# Patient Record
Sex: Female | Born: 1945 | Race: White | Hispanic: No | Marital: Married | Smoking: Former smoker
Health system: Southern US, Community
[De-identification: ages and names within clinical notes are randomized; demographics above are authoritative.]

## PROBLEM LIST (undated history)

## (undated) DIAGNOSIS — J449 Chronic obstructive pulmonary disease, unspecified: Secondary | ICD-10-CM

---

## 1995-04-02 ENCOUNTER — Encounter (INDEPENDENT_AMBULATORY_CARE_PROVIDER_SITE_OTHER): Payer: Self-pay | Admitting: *Deleted

## 1995-05-07 ENCOUNTER — Encounter (INDEPENDENT_AMBULATORY_CARE_PROVIDER_SITE_OTHER): Payer: Self-pay | Admitting: *Deleted

## 1997-01-05 ENCOUNTER — Encounter (INDEPENDENT_AMBULATORY_CARE_PROVIDER_SITE_OTHER): Payer: Self-pay | Admitting: *Deleted

## 1997-12-10 ENCOUNTER — Other Ambulatory Visit: Admission: RE | Admit: 1997-12-10 | Discharge: 1997-12-10 | Payer: Self-pay | Admitting: Family Medicine

## 1998-01-22 ENCOUNTER — Emergency Department (HOSPITAL_COMMUNITY): Admission: EM | Admit: 1998-01-22 | Discharge: 1998-01-22 | Payer: Self-pay | Admitting: Emergency Medicine

## 1998-08-25 ENCOUNTER — Encounter (INDEPENDENT_AMBULATORY_CARE_PROVIDER_SITE_OTHER): Payer: Self-pay | Admitting: *Deleted

## 1999-02-03 ENCOUNTER — Other Ambulatory Visit: Admission: RE | Admit: 1999-02-03 | Discharge: 1999-02-03 | Payer: Self-pay | Admitting: Family Medicine

## 1999-03-13 ENCOUNTER — Emergency Department (HOSPITAL_COMMUNITY): Admission: EM | Admit: 1999-03-13 | Discharge: 1999-03-13 | Payer: Self-pay | Admitting: Emergency Medicine

## 1999-03-24 ENCOUNTER — Encounter: Payer: Self-pay | Admitting: Family Medicine

## 1999-03-24 ENCOUNTER — Encounter: Admission: RE | Admit: 1999-03-24 | Discharge: 1999-03-24 | Payer: Self-pay | Admitting: Family Medicine

## 1999-04-11 ENCOUNTER — Encounter (INDEPENDENT_AMBULATORY_CARE_PROVIDER_SITE_OTHER): Payer: Self-pay | Admitting: *Deleted

## 1999-04-17 ENCOUNTER — Encounter: Admission: RE | Admit: 1999-04-17 | Discharge: 1999-04-17 | Payer: Self-pay | Admitting: Urology

## 1999-04-17 ENCOUNTER — Encounter: Payer: Self-pay | Admitting: Urology

## 1999-05-10 ENCOUNTER — Encounter (INDEPENDENT_AMBULATORY_CARE_PROVIDER_SITE_OTHER): Payer: Self-pay | Admitting: *Deleted

## 1999-06-20 ENCOUNTER — Other Ambulatory Visit: Admission: RE | Admit: 1999-06-20 | Discharge: 1999-06-20 | Payer: Self-pay | Admitting: Podiatry

## 1999-11-01 ENCOUNTER — Encounter: Payer: Self-pay | Admitting: Gastroenterology

## 2000-06-20 ENCOUNTER — Other Ambulatory Visit: Admission: RE | Admit: 2000-06-20 | Discharge: 2000-06-20 | Payer: Self-pay | Admitting: Family Medicine

## 2000-07-11 ENCOUNTER — Encounter: Admission: RE | Admit: 2000-07-11 | Discharge: 2000-07-11 | Payer: Self-pay | Admitting: Family Medicine

## 2000-07-11 ENCOUNTER — Encounter: Payer: Self-pay | Admitting: Family Medicine

## 2001-07-30 ENCOUNTER — Other Ambulatory Visit: Admission: RE | Admit: 2001-07-30 | Discharge: 2001-07-30 | Payer: Self-pay | Admitting: Family Medicine

## 2001-09-17 ENCOUNTER — Encounter: Payer: Self-pay | Admitting: Family Medicine

## 2001-09-17 ENCOUNTER — Encounter: Admission: RE | Admit: 2001-09-17 | Discharge: 2001-09-17 | Payer: Self-pay | Admitting: Family Medicine

## 2002-12-11 ENCOUNTER — Other Ambulatory Visit: Admission: RE | Admit: 2002-12-11 | Discharge: 2002-12-11 | Payer: Self-pay | Admitting: Family Medicine

## 2002-12-24 ENCOUNTER — Encounter: Admission: RE | Admit: 2002-12-24 | Discharge: 2002-12-24 | Payer: Self-pay | Admitting: Family Medicine

## 2002-12-24 ENCOUNTER — Encounter: Payer: Self-pay | Admitting: Family Medicine

## 2002-12-24 ENCOUNTER — Encounter (INDEPENDENT_AMBULATORY_CARE_PROVIDER_SITE_OTHER): Payer: Self-pay | Admitting: *Deleted

## 2003-09-15 ENCOUNTER — Encounter: Admission: RE | Admit: 2003-09-15 | Discharge: 2003-09-15 | Payer: Self-pay | Admitting: Family Medicine

## 2003-09-15 ENCOUNTER — Encounter (INDEPENDENT_AMBULATORY_CARE_PROVIDER_SITE_OTHER): Payer: Self-pay | Admitting: *Deleted

## 2004-05-15 ENCOUNTER — Other Ambulatory Visit: Admission: RE | Admit: 2004-05-15 | Discharge: 2004-05-15 | Payer: Self-pay | Admitting: Family Medicine

## 2004-06-20 ENCOUNTER — Encounter: Admission: RE | Admit: 2004-06-20 | Discharge: 2004-06-20 | Payer: Self-pay | Admitting: Family Medicine

## 2004-07-27 LAB — CONVERTED CEMR LAB: Pap Smear: NORMAL

## 2005-07-25 ENCOUNTER — Ambulatory Visit: Payer: Self-pay | Admitting: Gastroenterology

## 2005-08-06 ENCOUNTER — Other Ambulatory Visit: Admission: RE | Admit: 2005-08-06 | Discharge: 2005-08-06 | Payer: Self-pay | Admitting: Family Medicine

## 2005-08-06 ENCOUNTER — Encounter (INDEPENDENT_AMBULATORY_CARE_PROVIDER_SITE_OTHER): Payer: Self-pay | Admitting: *Deleted

## 2005-08-07 ENCOUNTER — Encounter: Admission: RE | Admit: 2005-08-07 | Discharge: 2005-08-07 | Payer: Self-pay | Admitting: Family Medicine

## 2005-08-08 ENCOUNTER — Ambulatory Visit: Payer: Self-pay | Admitting: Gastroenterology

## 2005-08-08 ENCOUNTER — Encounter (INDEPENDENT_AMBULATORY_CARE_PROVIDER_SITE_OTHER): Payer: Self-pay | Admitting: *Deleted

## 2006-09-10 ENCOUNTER — Encounter (INDEPENDENT_AMBULATORY_CARE_PROVIDER_SITE_OTHER): Payer: Self-pay | Admitting: *Deleted

## 2006-09-10 ENCOUNTER — Encounter: Admission: RE | Admit: 2006-09-10 | Discharge: 2006-09-10 | Payer: Self-pay | Admitting: Family Medicine

## 2007-07-18 ENCOUNTER — Encounter (INDEPENDENT_AMBULATORY_CARE_PROVIDER_SITE_OTHER): Payer: Self-pay | Admitting: *Deleted

## 2007-07-18 ENCOUNTER — Encounter: Admission: RE | Admit: 2007-07-18 | Discharge: 2007-07-18 | Payer: Self-pay | Admitting: Family Medicine

## 2007-07-22 ENCOUNTER — Encounter (INDEPENDENT_AMBULATORY_CARE_PROVIDER_SITE_OTHER): Payer: Self-pay | Admitting: *Deleted

## 2007-08-28 ENCOUNTER — Encounter (INDEPENDENT_AMBULATORY_CARE_PROVIDER_SITE_OTHER): Payer: Self-pay | Admitting: *Deleted

## 2007-10-14 ENCOUNTER — Encounter: Admission: RE | Admit: 2007-10-14 | Discharge: 2007-10-14 | Payer: Self-pay | Admitting: Family Medicine

## 2007-10-14 ENCOUNTER — Encounter (INDEPENDENT_AMBULATORY_CARE_PROVIDER_SITE_OTHER): Payer: Self-pay | Admitting: *Deleted

## 2008-07-14 ENCOUNTER — Telehealth: Payer: Self-pay | Admitting: Gastroenterology

## 2008-07-15 ENCOUNTER — Encounter (INDEPENDENT_AMBULATORY_CARE_PROVIDER_SITE_OTHER): Payer: Self-pay | Admitting: *Deleted

## 2008-07-30 ENCOUNTER — Ambulatory Visit: Payer: Self-pay | Admitting: *Deleted

## 2008-07-30 DIAGNOSIS — F329 Major depressive disorder, single episode, unspecified: Secondary | ICD-10-CM

## 2008-07-30 DIAGNOSIS — M1990 Unspecified osteoarthritis, unspecified site: Secondary | ICD-10-CM | POA: Insufficient documentation

## 2008-07-30 DIAGNOSIS — K219 Gastro-esophageal reflux disease without esophagitis: Secondary | ICD-10-CM

## 2008-07-30 DIAGNOSIS — J309 Allergic rhinitis, unspecified: Secondary | ICD-10-CM | POA: Insufficient documentation

## 2008-07-30 DIAGNOSIS — E785 Hyperlipidemia, unspecified: Secondary | ICD-10-CM | POA: Insufficient documentation

## 2008-08-05 DIAGNOSIS — K449 Diaphragmatic hernia without obstruction or gangrene: Secondary | ICD-10-CM | POA: Insufficient documentation

## 2008-08-05 DIAGNOSIS — J449 Chronic obstructive pulmonary disease, unspecified: Secondary | ICD-10-CM

## 2008-08-05 DIAGNOSIS — M810 Age-related osteoporosis without current pathological fracture: Secondary | ICD-10-CM | POA: Insufficient documentation

## 2008-08-05 DIAGNOSIS — J4489 Other specified chronic obstructive pulmonary disease: Secondary | ICD-10-CM | POA: Insufficient documentation

## 2008-08-05 DIAGNOSIS — R319 Hematuria, unspecified: Secondary | ICD-10-CM

## 2008-09-20 ENCOUNTER — Ambulatory Visit: Payer: Self-pay | Admitting: Radiology

## 2008-09-20 ENCOUNTER — Ambulatory Visit (HOSPITAL_BASED_OUTPATIENT_CLINIC_OR_DEPARTMENT_OTHER): Admission: RE | Admit: 2008-09-20 | Discharge: 2008-09-20 | Payer: Self-pay | Admitting: Internal Medicine

## 2008-09-20 ENCOUNTER — Telehealth: Payer: Self-pay | Admitting: Internal Medicine

## 2008-09-20 ENCOUNTER — Ambulatory Visit: Payer: Self-pay | Admitting: Gastroenterology

## 2008-09-20 ENCOUNTER — Ambulatory Visit: Payer: Self-pay | Admitting: Internal Medicine

## 2008-09-20 LAB — CONVERTED CEMR LAB
AST: 22 units/L (ref 0–37)
Albumin: 3.8 g/dL (ref 3.5–5.2)
Alkaline Phosphatase: 99 units/L (ref 39–117)
Basophils Relative: 0.5 % (ref 0.0–3.0)
Bilirubin, Direct: 0.1 mg/dL (ref 0.0–0.3)
Calcium: 9.4 mg/dL (ref 8.4–10.5)
Eosinophils Absolute: 0.3 10*3/uL (ref 0.0–0.7)
Eosinophils Relative: 5.5 % — ABNORMAL HIGH (ref 0.0–5.0)
GFR calc non Af Amer: 107 mL/min (ref >60)
Glucose, Bld: 93 mg/dL (ref 70–99)
HDL: 63.1 mg/dL (ref >39.00)
Hemoglobin: 15.3 g/dL — ABNORMAL HIGH (ref 12.0–15.0)
Lymphocytes Relative: 22.3 % (ref 12.0–46.0)
Monocytes Relative: 10.1 % (ref 3.0–12.0)
Neutro Abs: 4 10*3/uL (ref 1.4–7.7)
Neutrophils Relative %: 61.6 % (ref 43.0–77.0)
Potassium: 3.8 meq/L (ref 3.5–5.1)
RBC: 4.68 M/uL (ref 3.87–5.11)
Sodium: 142 meq/L (ref 135–145)
TSH: 1.75 microintl units/mL (ref 0.35–5.50)
Total CHOL/HDL Ratio: 3
Total Protein: 7.1 g/dL (ref 6.0–8.3)
VLDL: 19.6 mg/dL (ref 0.0–40.0)
WBC: 6.3 10*3/uL (ref 4.5–10.5)

## 2008-09-21 ENCOUNTER — Ambulatory Visit: Payer: Self-pay | Admitting: Internal Medicine

## 2008-09-21 ENCOUNTER — Encounter: Payer: Self-pay | Admitting: Internal Medicine

## 2008-09-23 ENCOUNTER — Encounter: Admission: RE | Admit: 2008-09-23 | Discharge: 2008-09-23 | Payer: Self-pay | Admitting: Internal Medicine

## 2008-09-23 ENCOUNTER — Telehealth: Payer: Self-pay | Admitting: Internal Medicine

## 2008-09-27 ENCOUNTER — Ambulatory Visit: Payer: Self-pay | Admitting: Gastroenterology

## 2008-09-27 ENCOUNTER — Encounter: Payer: Self-pay | Admitting: Gastroenterology

## 2008-09-28 ENCOUNTER — Encounter: Payer: Self-pay | Admitting: Gastroenterology

## 2008-09-29 ENCOUNTER — Ambulatory Visit (HOSPITAL_COMMUNITY): Admission: RE | Admit: 2008-09-29 | Discharge: 2008-09-29 | Payer: Self-pay | Admitting: Internal Medicine

## 2008-09-30 ENCOUNTER — Encounter: Payer: Self-pay | Admitting: Internal Medicine

## 2008-09-30 ENCOUNTER — Ambulatory Visit: Payer: Self-pay | Admitting: Critical Care Medicine

## 2008-10-06 ENCOUNTER — Ambulatory Visit: Payer: Self-pay | Admitting: Thoracic Surgery

## 2008-10-28 ENCOUNTER — Ambulatory Visit: Payer: Self-pay | Admitting: Thoracic Surgery

## 2008-10-28 ENCOUNTER — Encounter: Payer: Self-pay | Admitting: Thoracic Surgery

## 2008-10-28 ENCOUNTER — Inpatient Hospital Stay (HOSPITAL_COMMUNITY): Admission: RE | Admit: 2008-10-28 | Discharge: 2008-11-03 | Payer: Self-pay | Admitting: Thoracic Surgery

## 2008-11-08 ENCOUNTER — Ambulatory Visit: Payer: Self-pay | Admitting: Diagnostic Radiology

## 2008-11-08 ENCOUNTER — Encounter: Payer: Self-pay | Admitting: Emergency Medicine

## 2008-11-08 ENCOUNTER — Inpatient Hospital Stay (HOSPITAL_COMMUNITY): Admission: EM | Admit: 2008-11-08 | Discharge: 2008-11-13 | Payer: Self-pay | Admitting: Emergency Medicine

## 2008-11-19 ENCOUNTER — Ambulatory Visit: Payer: Self-pay | Admitting: Thoracic Surgery

## 2008-11-23 ENCOUNTER — Ambulatory Visit: Payer: Self-pay | Admitting: Thoracic Surgery

## 2008-11-23 ENCOUNTER — Encounter: Admission: RE | Admit: 2008-11-23 | Discharge: 2008-11-23 | Payer: Self-pay | Admitting: Thoracic Surgery

## 2008-12-08 ENCOUNTER — Encounter: Admission: RE | Admit: 2008-12-08 | Discharge: 2008-12-08 | Payer: Self-pay | Admitting: Thoracic Surgery

## 2008-12-08 ENCOUNTER — Ambulatory Visit: Payer: Self-pay | Admitting: Thoracic Surgery

## 2008-12-10 ENCOUNTER — Telehealth: Payer: Self-pay | Admitting: Internal Medicine

## 2008-12-28 ENCOUNTER — Ambulatory Visit: Payer: Self-pay | Admitting: Family Medicine

## 2008-12-28 DIAGNOSIS — H6980 Other specified disorders of Eustachian tube, unspecified ear: Secondary | ICD-10-CM

## 2009-01-12 ENCOUNTER — Ambulatory Visit: Payer: Self-pay | Admitting: *Deleted

## 2009-01-26 ENCOUNTER — Encounter: Admission: RE | Admit: 2009-01-26 | Discharge: 2009-01-26 | Payer: Self-pay | Admitting: Thoracic Surgery

## 2009-01-26 ENCOUNTER — Ambulatory Visit: Payer: Self-pay | Admitting: Thoracic Surgery

## 2009-01-31 ENCOUNTER — Telehealth: Payer: Self-pay | Admitting: Internal Medicine

## 2009-02-01 ENCOUNTER — Ambulatory Visit: Payer: Self-pay | Admitting: Family Medicine

## 2009-04-05 ENCOUNTER — Encounter: Admission: RE | Admit: 2009-04-05 | Discharge: 2009-04-05 | Payer: Self-pay | Admitting: Thoracic Surgery

## 2009-04-05 ENCOUNTER — Ambulatory Visit: Payer: Self-pay | Admitting: Thoracic Surgery

## 2009-05-09 ENCOUNTER — Ambulatory Visit: Payer: Self-pay | Admitting: Family Medicine

## 2009-08-17 ENCOUNTER — Ambulatory Visit: Payer: Self-pay | Admitting: Family Medicine

## 2009-08-17 DIAGNOSIS — R1013 Epigastric pain: Secondary | ICD-10-CM | POA: Insufficient documentation

## 2009-08-18 LAB — CONVERTED CEMR LAB
ALT: 10 units/L (ref 0–35)
Amylase: 30 units/L (ref 0–105)
CO2: 26 meq/L (ref 19–32)
Calcium: 9.4 mg/dL (ref 8.4–10.5)
Chloride: 104 meq/L (ref 96–112)
Creatinine, Ser: 0.71 mg/dL (ref 0.40–1.20)
Eosinophils Absolute: 0.3 10*3/uL (ref 0.0–0.7)
Glucose, Bld: 83 mg/dL (ref 70–99)
HCT: 44.9 % (ref 36.0–46.0)
Hemoglobin: 14.8 g/dL (ref 12.0–15.0)
Lipase: 27 units/L (ref 0–75)
Lymphs Abs: 2.2 10*3/uL (ref 0.7–4.0)
MCHC: 33 g/dL (ref 30.0–36.0)
MCV: 92.8 fL (ref 78.0–100.0)
Monocytes Absolute: 0.7 10*3/uL (ref 0.1–1.0)
Monocytes Relative: 10 % (ref 3–12)
Neutrophils Relative %: 53 % (ref 43–77)
RBC: 4.84 M/uL (ref 3.87–5.11)
Total Protein: 6.9 g/dL (ref 6.0–8.3)
WBC: 6.8 10*3/uL (ref 4.0–10.5)

## 2009-08-31 ENCOUNTER — Ambulatory Visit: Payer: Self-pay | Admitting: Family Medicine

## 2009-09-27 ENCOUNTER — Ambulatory Visit: Payer: Self-pay | Admitting: Family Medicine

## 2009-09-27 DIAGNOSIS — L290 Pruritus ani: Secondary | ICD-10-CM

## 2009-10-21 ENCOUNTER — Ambulatory Visit: Payer: Self-pay | Admitting: Family Medicine

## 2009-10-26 ENCOUNTER — Encounter: Payer: Self-pay | Admitting: Family Medicine

## 2009-10-27 LAB — CONVERTED CEMR LAB
ALT: 8 units/L (ref 0–35)
AST: 14 units/L (ref 0–37)
Alkaline Phosphatase: 111 units/L (ref 39–117)
Basophils Absolute: 0 10*3/uL (ref 0.0–0.1)
Basophils Relative: 0 % (ref 0–1)
LDL Cholesterol: 111 mg/dL — ABNORMAL HIGH (ref 0–99)
MCHC: 32.6 g/dL (ref 30.0–36.0)
Neutro Abs: 3.7 10*3/uL (ref 1.7–7.7)
Neutrophils Relative %: 62 % (ref 43–77)
RBC: 4.62 M/uL (ref 3.87–5.11)
RDW: 12.8 % (ref 11.5–15.5)
Sodium: 141 meq/L (ref 135–145)
Total Bilirubin: 0.5 mg/dL (ref 0.3–1.2)
Total Protein: 7 g/dL (ref 6.0–8.3)
Triglycerides: 123 mg/dL (ref <150)
VLDL: 25 mg/dL (ref 0–40)

## 2009-11-15 ENCOUNTER — Encounter: Admission: RE | Admit: 2009-11-15 | Discharge: 2009-11-15 | Payer: Self-pay | Admitting: Family Medicine

## 2009-12-26 ENCOUNTER — Encounter: Payer: Self-pay | Admitting: Internal Medicine

## 2009-12-26 ENCOUNTER — Ambulatory Visit: Payer: Self-pay | Admitting: Family Medicine

## 2010-01-05 ENCOUNTER — Ambulatory Visit: Payer: Self-pay | Admitting: Critical Care Medicine

## 2010-03-27 ENCOUNTER — Ambulatory Visit: Payer: Self-pay | Admitting: Family Medicine

## 2010-04-05 DIAGNOSIS — K5730 Diverticulosis of large intestine without perforation or abscess without bleeding: Secondary | ICD-10-CM | POA: Insufficient documentation

## 2010-04-05 DIAGNOSIS — Z860100 Personal history of colon polyps, unspecified: Secondary | ICD-10-CM | POA: Insufficient documentation

## 2010-04-07 ENCOUNTER — Ambulatory Visit: Payer: Self-pay | Admitting: Gastroenterology

## 2010-04-07 ENCOUNTER — Encounter (INDEPENDENT_AMBULATORY_CARE_PROVIDER_SITE_OTHER): Payer: Self-pay | Admitting: *Deleted

## 2010-04-10 ENCOUNTER — Encounter: Payer: Self-pay | Admitting: Gastroenterology

## 2010-04-11 ENCOUNTER — Ambulatory Visit (HOSPITAL_COMMUNITY): Admission: RE | Admit: 2010-04-11 | Discharge: 2010-04-11 | Payer: Self-pay | Admitting: Gastroenterology

## 2010-04-11 LAB — CONVERTED CEMR LAB: Tissue Transglutaminase Ab, IgA: 8.6 units (ref <20)

## 2010-05-01 ENCOUNTER — Ambulatory Visit: Payer: Self-pay | Admitting: Gastroenterology

## 2010-05-01 DIAGNOSIS — K2990 Gastroduodenitis, unspecified, without bleeding: Secondary | ICD-10-CM

## 2010-05-01 DIAGNOSIS — K2970 Gastritis, unspecified, without bleeding: Secondary | ICD-10-CM | POA: Insufficient documentation

## 2010-05-04 ENCOUNTER — Encounter: Payer: Self-pay | Admitting: Gastroenterology

## 2010-05-04 LAB — CONVERTED CEMR LAB
ALT: 14 units/L (ref 0–35)
Alkaline Phosphatase: 70 units/L (ref 39–117)
Amylase: 36 units/L (ref 27–131)
Basophils Absolute: 0 10*3/uL (ref 0.0–0.1)
Bilirubin, Direct: 0.1 mg/dL (ref 0.0–0.3)
CO2: 27 meq/L (ref 19–32)
Chloride: 106 meq/L (ref 96–112)
Creatinine, Ser: 0.7 mg/dL (ref 0.4–1.2)
Hemoglobin: 14.4 g/dL (ref 12.0–15.0)
Lipase: 44 units/L (ref 11.0–59.0)
Lymphocytes Relative: 28.1 % (ref 12.0–46.0)
Monocytes Relative: 12.1 % — ABNORMAL HIGH (ref 3.0–12.0)
Neutro Abs: 2.8 10*3/uL (ref 1.4–7.7)
RBC: 4.42 M/uL (ref 3.87–5.11)
RDW: 13.1 % (ref 11.5–14.6)
Saturation Ratios: 47.7 % (ref 20.0–50.0)
Total Protein: 6.8 g/dL (ref 6.0–8.3)
Transferrin: 260.5 mg/dL (ref 212.0–360.0)
Vitamin B-12: 460 pg/mL (ref 211–911)
WBC: 5.2 10*3/uL (ref 4.5–10.5)

## 2010-05-11 ENCOUNTER — Ambulatory Visit: Payer: Self-pay | Admitting: Critical Care Medicine

## 2010-05-16 ENCOUNTER — Ambulatory Visit: Payer: Self-pay | Admitting: Gastroenterology

## 2010-05-16 DIAGNOSIS — K589 Irritable bowel syndrome without diarrhea: Secondary | ICD-10-CM

## 2010-06-12 ENCOUNTER — Ambulatory Visit (HOSPITAL_COMMUNITY)
Admission: RE | Admit: 2010-06-12 | Discharge: 2010-06-12 | Payer: Self-pay | Source: Home / Self Care | Attending: Gastroenterology | Admitting: Gastroenterology

## 2010-06-15 ENCOUNTER — Ambulatory Visit
Admission: RE | Admit: 2010-06-15 | Discharge: 2010-06-15 | Payer: Self-pay | Source: Home / Self Care | Attending: Gastroenterology | Admitting: Gastroenterology

## 2010-06-25 ENCOUNTER — Encounter: Payer: Self-pay | Admitting: Internal Medicine

## 2010-06-26 ENCOUNTER — Encounter: Payer: Self-pay | Admitting: Thoracic Surgery

## 2010-06-27 ENCOUNTER — Ambulatory Visit
Admission: RE | Admit: 2010-06-27 | Discharge: 2010-06-27 | Payer: Self-pay | Source: Home / Self Care | Attending: Family Medicine | Admitting: Family Medicine

## 2010-07-02 LAB — CONVERTED CEMR LAB: Vit D, 1,25-Dihydroxy: 37

## 2010-07-04 NOTE — Assessment & Plan Note (Signed)
Summary: f/u mood   Vital Signs:  Patient profile:   65 year old female Height:      62 inches Weight:      119 pounds BMI:     21.84 O2 Sat:      97 % on Room air Pulse rate:   61 / minute BP sitting:   125 / 83  (left arm) Cuff size:   regular  Vitals Entered By: Payton Spark CMA (December 26, 2009 9:43 AM)  O2 Flow:  Room air CC: F/U mood   Primary Care Provider:  Seymour Bars DO  CC:  F/U mood.  History of Present Illness: 65 yo WF presents for f/u mood.  She was restarted on her SSRI 2 mos ago.  She reports being lonely even though she has her husband for support.  She spent so much time taking care of her mom, who died that she lost touch with her friends.  She is having trouble sleeping at night.  Taking Citalopram 10 mg in the AM.  She was restarted on Omeprazole last visit, 40 mg/ day.  She reports continuing to have some episodic a abdominal pains.  She has some constipation.  She has some epigastric pressure.  It is worse after eating or from eating a lot.         Current Medications (verified): 1)  Crestor 10 Mg Tabs (Rosuvastatin Calcium) .... Take 1 Tablet By Mouth Once A Day 2)  Veramyst 27.5 Mcg/spray Susp (Fluticasone Furoate) .... 2 Sprays Per Nostril Daily 3)  Citalopram Hydrobromide 10 Mg Tabs (Citalopram Hydrobromide) .Marland Kitchen.. 1 Tab By Mouth Daily 4)  Omeprazole 40 Mg Cpdr (Omeprazole) .Marland Kitchen.. 1 Tab By Mouth Daily, Take 30 Min Before Breakfast  Allergies (verified): 1)  ! Celebrex 2)  ! Vioxx 3)  ! Codeine 4)  ! Boniva (Ibandronate Sodium) 5)  ! Prednisone  Past History:  Past Medical History: Allergic rhinitis Colonic polyps, hx of Depression Hyperlipidemia  Osteoarthritis hiatal hernia GERD Hx of Tobacco abuse lung nodule, non cancerous 2010 moderate COPD on PFTs -- Dr Delford Field 09-2008  Past Surgical History: Reviewed history from 02/01/2009 and no changes required. spinal fusion 1998 Hysterectomy  removal of lung nodule Dr Edwyna Shell 10-2008 with  post op lung collapse  Family History: Reviewed history from 05/09/2009 and no changes required. Family History Diabetes 1st degree relative Family History High cholesterol Family History Hypertension Breast ca - no Ovarian ca - no Diabetes Dementia - mother; died at 95  Social History: Reviewed history from 08/17/2009 and no changes required. 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   Head:  normocephalic and atraumatic.   Eyes:  sclera non icteric Mouth:  pharynx pink and moist.   Neck:  no masses.   Lungs:  prolonged exp phase w/o audible W/R/R; nonlabored Heart:  Normal rate and regular rhythm. S1 and S2 normal without gallop, murmur, click, rub or other extra sounds. Abdomen:  soft.  slight epigastric TTP but no R/G/R, no HSM Extremities:  no LE edema Skin:  no pallor or jaundice Cervical Nodes:  No lymphadenopathy noted Psych:  good eye contact, not anxious appearing, and not depressed appearing.     Impression & Recommendations:  Problem # 1:  DEPRESSION (ICD-311) PHQ9  score of 6 c/w mild depression.  Will increase her Citalopram from 10--> 20 mg/ day and add Clonazepam at night for sleep.  Encouraged more social interaction as loneliness and lack of social life seems to be a big part of her mood.  Call if any problems, o/w f/u in 3 mos. Her updated medication list for this problem includes:    Citalopram Hydrobromide 20 Mg Tabs (Citalopram hydrobromide) .Marland Kitchen... 1 tab by mouth qhs    Clonazepam 0.5 Mg Tabs (Clonazepam) .Marland Kitchen... 1-2 tabs by mouth at bedtime as needed anxiety/ sleep  Problem # 2:  EPIGASTRIC PAIN (ICD-789.06) Unchanged.  She has continued to c/o epigastric pain since her last visit even with daily PPI usage.  She has hx of  hiatal hernia and does have some symptoms of acid reflux.  We have already discussed dietary modificiation, reflux precautions.  She sees Dr Jarold Motto and had a colonoscopy in 2010 but has not had an EGD for years.  I will get her back in to see him to see if an EGD is in order.  Continue Omeprazole daily.  Problem # 3:  COPD (ICD-496) Had PFTs with Dr Delford Field in the Spring of 2010 and had moderate COPD.  Since then, she has quit smoking.  She is not using any inhalers and denies chronic cough.  Has some DOE but not enough to 'bother' her.  I'd like to get her back in with Dr Delford Field for re-eval.  may need repeat PFTs to see if improved off cigarettes.  She likely needs to be maintained on Spiriva but she is resistant to this today.  Complete Medication List: 1)  Crestor 10 Mg Tabs (Rosuvastatin calcium) .... Take 1 tablet by mouth once a day 2)  Veramyst 27.5 Mcg/spray Susp (Fluticasone furoate) .... 2 sprays per nostril daily 3)  Citalopram Hydrobromide 20 Mg Tabs (Citalopram hydrobromide) .Marland Kitchen.. 1 tab by mouth qhs 4)  Omeprazole 40 Mg Cpdr (Omeprazole) .Marland Kitchen.. 1 tab by mouth daily, take 30 min before breakfast 5)  Clonazepam 0.5 Mg Tabs (Clonazepam) .Marland Kitchen.. 1-2 tabs by mouth at bedtime as needed anxiety/ sleep  Patient Instructions: 1)  Increase Citalopram to 20 mg and take this at night. 2)  Use Clonazepam 1-2 tabs at bedtime as needed for sleep/ anxiety. 3)  Call if any problems. 4)  Will get you back in with Dr Delford Field and Dr Jarold Motto.   5)  Return for f/u in 3 mos. Prescriptions: CLONAZEPAM 0.5 MG TABS (CLONAZEPAM) 1-2 tabs by mouth at bedtime as needed anxiety/ sleep  #60 x 0   Entered and Authorized by:   Seymour Bars DO   Signed by:   Seymour Bars DO on 12/26/2009   Method used:   Printed then faxed to ...       [facility] [phone]* (retail)       [address]       [place], [place]  [address]       Ph: [phone]       Fax: [phone]   RxID:    [id number] CITALOPRAM HYDROBROMIDE 20 MG TABS (CITALOPRAM HYDROBROMIDE) 1 tab by mouth qhs  #30 x 3   Entered and Authorized by:   Seymour Bars DO   Signed by:   Seymour Bars DO on 12/26/2009   Method used:   Electronically to        [facility] [phone]* (retail)  [address]       [place], [place]  [address]       Ph: [phone]       Fax: [phone]   RxID:   [id number]

## 2010-07-04 NOTE — Letter (Signed)
Summary: Depression Questionnaire  Depression Questionnaire   Imported By: Lanelle Bal 01/02/2010 10:55:44  _____________________________________________________________________  External Attachment:    Type:   Image     Comment:   External Document

## 2010-07-04 NOTE — Procedures (Signed)
Summary: Upper Endoscopy  Patient: Linda Rangel Note: All result statuses are Final unless otherwise noted.  Tests: (1) Upper Endoscopy (EGD)   EGD Upper Endoscopy       DONE     [facility]     [address]     [place], [place]  [address]           ENDOSCOPY PROCEDURE REPORT           PATIENT:  [name], [name]  MR#:  [id number]     BIRTHDATE:  [birth year], 64 yrs. old  GENDER:  female           ENDOSCOPIST:  Vania Rea. Jarold Motto, MD, [facility]     Referred by:  Seymour Bars, D.O.           PROCEDURE DATE:  05/01/2010     PROCEDURE:  EGD with biopsy, [address], Elease Hashimoto Dilation of Esophagus     ASA CLASS:  Class III     INDICATIONS:  chest pain, small bowel biopsy to rule out celiac     sprue ABDOMINAL ULTRASOUND IS NORMAL.           MEDICATIONS:   Fentanyl 75 mcg IV, Versed 8 mg IV     TOPICAL ANESTHETIC:  Exactacain Spray           DESCRIPTION OF PROCEDURE:   After the risks benefits and     alternatives of the procedure were thoroughly explained, informed     consent was obtained.  The LB GIF-H180 [id number] endoscope was     introduced through the mouth and advanced to the second portion of     the duodenum, without limitations.  The instrument was slowly     withdrawn as the mucosa was fully examined.     <<PROCEDUREIMAGES>>           Normal GE junction was noted. DILATED #70F MALONEY DILATOR.     Moderate gastritis was found in the antrum. NODULAR AND FRIABLE     ANTRUM AND BULB.CLO AND REGULAR BIOPSIES.  other findings.     FLATTENED DUODENAL FOLDS BIOPSIED.    Retroflexed views revealed     no abnormalities.    The scope was then withdrawn from the patient     and the procedure completed.           COMPLICATIONS:  None           ENDOSCOPIC IMPRESSION:     1) Normal GE junction     2) Moderate gastritis in the antrum     3) Other findings     1.TREATED GERD AND ?? STRICTURE DILATED.     2.R/O H.PYLORI.     3.R/O CELIAC DISEASE PER ++ SEROLOGY.  RECOMMENDATIONS:     1) Await pathology results     2) OP follow-up in 2 weeks.     3) Rx CLO if positive     4) continue current medications           REPEAT EXAM:  No           ______________________________     Vania Rea. Jarold Motto, MD, [facility]           CC:  Karle Plumber, MD           n.     Rosalie Doctor:   Vania Rea. Patterson at 05/01/2010 03:39 PM           Lytle Michaels, [phone]  Note: An exclamation mark Marland Kitchen)  indicates a result that was not dispersed into the flowsheet. Document Creation Date: 05/01/2010 3:40 PM _______________________________________________________________________  (1) Order result status: Final Collection or observation date-time: 05/01/2010 15:28 Requested date-time:  Receipt date-time:  Reported date-time:  Referring Physician:   Ordering Physician: Sheryn Bison [phone]) Specimen Source:  Source: Launa Grill Order Number: [id number] Lab site:

## 2010-07-04 NOTE — Assessment & Plan Note (Signed)
Summary: epigastric pain   Vital Signs:  Patient profile:   65 year old female Height:      62 inches Weight:      117 pounds BMI:     21.48 O2 Sat:      98 % on Room air Temp:     98.1 degrees F oral Pulse rate:   78 / minute BP sitting:   131 / 78  (left arm) Cuff size:   regular  Vitals Entered By: Payton Spark CMA (August 17, 2009 1:33 PM)  O2 Flow:  Room air CC: Hernia pressure and belching x 2 months. Prevacid not helping.   Primary Care Provider:  Seymour Bars DO  CC:  Hernia pressure and belching x 2 months. Prevacid not helping.Marland Kitchen  History of Present Illness: 65 yo WF presents for problems with her hiatal hernia.  She is taking Prevacid OTc almost everyday.  It has not helped.  She has epigastric pain.  The pain is worse with eating.  She has some belching and bloating.  She is taking MOM and stool softener.  Denies melena or hematochezia.  She has gained weight.  She feels hungry but it causes early satiety and dyspepsia with epigastric pain.  This started about 2 mos ago.   She has never had PUD.  She has been taking Advil PM at night most nights.  She drinks ETOH - 2 drinks/ day.  Her last EGD was in 1999.    Current Medications (verified): 1)  Crestor 10 Mg Tabs (Rosuvastatin Calcium) .... Take 1 Tablet By Mouth Once A Day 2)  Zyrtec Allergy 10 Mg Tabs (Cetirizine Hcl) .Marland Kitchen.. 1 Tab By Mouth Qpm 3)  Veramyst 27.5 Mcg/spray Susp (Fluticasone Furoate) .... 2 Sprays Per Nostril Daily 4)  Spiriva Handihaler 18 Mcg  Caps (Tiotropium Bromide Monohydrate) .... Two Puffs in Handihaler Daily  Allergies (verified): 1)  ! Celebrex 2)  ! Vioxx 3)  ! Codeine 4)  ! Boniva (Ibandronate Sodium) 5)  ! Prednisone  Past History:  Past Medical History: Reviewed history from 02/01/2009 and no changes required. Allergic rhinitis Colonic polyps, hx of Depression Hyperlipidemia  Osteoarthritis hiatal hernia GERD Hx of Tobacco abuse lung nodule, non cancerous 2010  Past  Surgical History: Reviewed history from 02/01/2009 and no changes required. spinal fusion 1998 Hysterectomy  removal of lung nodule Dr Edwyna Shell 10-2008 with post op lung collapse  Social History: Reviewed history from 05/09/2009 and no changes required. 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   Head:  normocephalic and atraumatic.   Eyes:  sclera non icteric Nose:  no nasal discharge.   Mouth:  pharynx pink and moist.   Neck:  no masses.   Lungs:  Normal respiratory effort, chest expands symmetrically. Lungs are clear to auscultation, no crackles or wheezes. Heart:  Normal rate and regular rhythm. S1 and S2 normal without gallop, murmur, click, rub or other extra sounds. Abdomen:  soft.  slight epigastric TTP w/o R/G/R.  NABS.  No AA bruits.  Neg Murphys sign.  no HSM. Extremities:  no LE edema Skin:  color normal.  no jaundice   Impression & Recommendations:  Problem # 1:  EPIGASTRIC PAIN (ICD-789.06) Assessment Deteriorated Likely reflux gastritis with hx of hiatal hernia.  Will r/o anemia, gall bladder dysfunction, hepatic dysfunction, pancreatitis with labs today. Start on samples of Dexilant 1 tab daily for reflux.  Work on reflux precautions. F/U in 2-3 wks.   Orders: T-CBC w/Diff ([id number]) T-Comprehensive Metabolic Panel [phone] [phone]) T-Amylase ([id number]) T-Lipase ([id number])  Complete Medication List: 1)  Crestor 10 Mg Tabs (Rosuvastatin calcium) .... Take 1 tablet by mouth once a day 2)  Zyrtec Allergy 10 Mg Tabs (Cetirizine hcl) .Marland Kitchen.. 1 tab by mouth qpm 3)  Veramyst 27.5 Mcg/spray Susp (Fluticasone furoate) .... 2 sprays per nostril daily 4)  Spiriva Handihaler 18 Mcg Caps (Tiotropium bromide monohydrate) .... Two puffs  in handihaler daily  Patient Instructions: 1)  Labs today. 2)  Start on Dexilant 1 capsule by mouth once daily for acid reflux. 3)  Stick to reflux precautions. 4)  Will call you w/ lab results tomorrow. 5)  Return for f/u in 3 wks.

## 2010-07-04 NOTE — Assessment & Plan Note (Signed)
Summary: CPE   Vital Signs:  Patient profile:   65 year old female Height:      62 inches Weight:      117 pounds BMI:     21.48 O2 Sat:      96 % on Room air Pulse rate:   87 / minute BP sitting:   122 / 78  (left arm) Cuff size:   regular  Vitals Entered By: Payton Spark CMA (Oct 21, 2009 3:21 PM)  O2 Flow:  Room air CC: CPE w/ out pap. Not taking Crestor.   Primary Care Provider:  Seymour Bars DO  CC:  CPE w/ out pap. Not taking Crestor.Linda Rangel  History of Present Illness: 65 yo WF presents for CPE w/o pap smear.  She is married, postmenopausal.  Doing well other than continued epigastric pain with a known hiatal hernia.  She c/o feeling short winded but has no cough or wheezing.  Her colonoscopy was updated 1 yr ago.  DEXA done 1 yr ago showed osteopenia with a T score of -2.3.  Her mammogram and fasting labs are due.    She is not having any postmenopausal bleeding.    Current Medications (verified): 1)  Crestor 10 Mg Tabs (Rosuvastatin Calcium) .... Take 1 Tablet By Mouth Once A Day 2)  Veramyst 27.5 Mcg/spray Susp (Fluticasone Furoate) .... 2 Sprays Per Nostril Daily  Allergies (verified): 1)  ! Celebrex 2)  ! Vioxx 3)  ! Codeine 4)  ! Boniva (Ibandronate Sodium) 5)  ! Prednisone  Past History:  Past Medical History: Reviewed history from 02/01/2009 and no changes required. Allergic rhinitis Colonic polyps, hx of Depression Hyperlipidemia  Osteoarthritis hiatal hernia GERD Hx of Tobacco abuse lung nodule, non cancerous 2010  Past Surgical History: Reviewed history from 02/01/2009 and no changes required. spinal fusion 1998 Hysterectomy  removal of lung nodule Dr Edwyna Shell 10-2008 with post op lung collapse  Social History: Reviewed history from 08/17/2009 and no changes required. 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 14 yrs) 2 children and 1 step child  7 grandchildren Quit smoking 10-02-2008 Alcohol use-yes  Review of  Systems  The patient denies anorexia, fever, weight loss, weight gain, vision loss, decreased hearing, hoarseness, chest pain, syncope, dyspnea on exertion, peripheral edema, prolonged cough, headaches, hemoptysis, abdominal pain, melena, hematochezia, severe indigestion/heartburn, hematuria, incontinence, genital sores, muscle weakness, suspicious skin lesions, transient blindness, difficulty walking, depression, unusual weight change, abnormal bleeding, enlarged lymph nodes, angioedema, breast masses, and testicular masses.    Physical Exam  General:  alert, well-developed, well-nourished, and well-hydrated.   Head:  normocephalic and atraumatic.   Eyes:  PERRLA; wears glasses Ears:  EACs patent; TMs translucent and gray with good cone of light and bony landmarks.  Nose:  no nasal discharge.   Mouth:  good dentition and pharynx pink and moist.   Neck:  no masses.  no audible carotid bruits Lungs:  Normal respiratory effort, chest expands symmetrically. Lungs are clear to auscultation, no crackles or wheezes. Heart:  Normal rate and regular rhythm. S1 and S2 normal without gallop, murmur, click, rub or other extra sounds. Abdomen:  soft, non-tender, normal bowel sounds, no distention, no masses, no guarding, no hepatomegaly, and no splenomegaly.  no epigastric guarding Pulses:  2+ radial and pedal pulses Extremities:  no LE edema Skin:  color normal.   Cervical Nodes:  No lymphadenopathy noted Psych:  good eye contact, not anxious appearing, and depressed affect.  Impression & Recommendations:  Problem # 1:  HEALTH SCREENING (ICD-V70.0) Keeping healthy checklist for women reviewed. BP and BMI at goal. Tetanus UTD. Udpate fasting labs. DEXA due April 2012, osteopenia lat year. Colonoscopy done 09-2008, needs repeat in 2015 for polyps. Add MVI and Citirical daily. Work on [employer] and regular exericse. Add PPI for symptomatic hiatal hernia with epigastric pain and restart  SSRI. RTC in 2 mos for f/u.  Complete Medication List: 1)  Crestor 10 Mg Tabs (Rosuvastatin calcium) .... Take 1 tablet by mouth once a day 2)  Veramyst 27.5 Mcg/spray Susp (Fluticasone furoate) .... 2 sprays per nostril daily 3)  Citalopram Hydrobromide 10 Mg Tabs (Citalopram hydrobromide) .Linda Rangel.. 1 tab by mouth daily 4)  Omeprazole 40 Mg Cpdr (Omeprazole) .Linda Rangel.. 1 tab by mouth daily, take 30 min before breakfast  Other Orders: T-Comprehensive Metabolic Panel [id number]) T-Lipid Profile ([id number]) T-CBC w/Diff ([id number]) T-Mammography Bilateral Screening ([id number])  Patient Instructions: 1)  Stay on Crestor. 2)  Update fasting labs. 3)  Will call you w/ results. 4)  Have mammogram done downstairs. 5)  Take Citalopram once a day for mood. 6)  Take Omeprazole once a day for hiatal hernia. 7)  Return for f/u epigastric pain/ mood in 2 mos. Prescriptions: OMEPRAZOLE 40 MG CPDR (OMEPRAZOLE) 1 tab by mouth daily, take 30 min before breakfast  #30 x 3   Entered and Authorized by:   Seymour Bars DO   Signed by:   Seymour Bars DO on 10/21/2009   Method used:   [facility] to        [facility] [phone]* (retail)       [address]       [place], [place]  [address]       Ph: [phone]       Fax: [phone]   RxID:   [id number] CITALOPRAM HYDROBROMIDE 10 MG TABS (CITALOPRAM HYDROBROMIDE) 1 tab by mouth daily  #30 x 1   Entered and Authorized by:   Seymour Bars DO   Signed by:   Seymour Bars DO on 10/21/2009   Method used:   Electronically to        [facility] [phone]* (retail)       [address]       [place], [place]  [address]       Ph: [phone]       Fax: [phone]   RxID:   [id number] CRESTOR 10 MG TABS (ROSUVASTATIN CALCIUM) Take 1 tablet by mouth once a day  #30 x 6   Entered and Authorized by:   Seymour Bars DO   Signed by:   Seymour Bars DO on 10/21/2009   Method used:   Electronically to         [facility] [phone]* (retail)       [address]       [place], [place]  [address]       Ph: [phone]       Fax: [phone]   RxID:   [id number]

## 2010-07-04 NOTE — Assessment & Plan Note (Signed)
Summary: Pulmonary OV   Copy to:  Dr. Artist Pais Primary Provider/Referring Provider:  Seymour Bars DO  CC:  Pt here for follow up.  She was last seen 09/2008 and states Dr. Cathey Endow requested she f/u.  States breathing is doing well overall.  Denis "SOB on a regular basis, " wheezing, chest tightness, and cough.  Would like to discuss if she needs to be on spiriva long term.Marland Kitchen  History of Present Illness: Pulmonary OV     65 yo WF presented 09/2008 with LLL sup segment lung mass PET positive. Ultimately sent to surgery for resection.  Active smoker but quit prior to surgery.  Felt to be high likelihood to be CA. Resection showed necrotizing granulomatous disease.  C/S pos pseudomonas putida.  LNs reactive.  Pt had to return to OR for leak after VATS.       January 05, 2010 9:47 AM Had LLL sup segmentectomy 10/28/08.   pseudomonas putitda was the isolate  Pt then had PTX treated with CT.  Finally improved by 11/10 with normal CXR.   Currently:  some pressure in abdomen,  sl dyspnea with exertiojn.  Was on spiriva which she has stopped several weeks ago.  SHe was uncertain if she noticed any differece on spiriva.    Preventive Screening-Counseling & Management  Alcohol-Tobacco     Alcohol drinks/day: 1     Alcohol type: all     Alcohol Counseling: to STOP drinking     Smoking Status: quit < 6 months     Smoking Cessation Counseling: yes     Packs/Day: 0.5     Year Started: 1967     Year Quit: April 2010     Pack years: 1.2 to 1 PPD x40 yrs     Passive Smoke Exposure: yes     Tobacco Counseling: to quit use of tobacco products  Pulmonary Function Test Date: 01/05/2010 Height (in.): 62 Gender: Female  Pre-Spirometry FVC    Value: 2.62 L/min   Pred: 2.72 L/min     % Pred: 96 % FEV1    Value: 1.23 L     Pred: 1.96 L     % Pred: 62 % FEV1/FVC  Value: 47 %     Pred: 73 %     % Pred: 64 % FEF 25-75  Value: 0.47 L/min   Pred: 2.32 L/min     % Pred: 20 %  Current Medications (verified): 1)   Crestor 10 Mg Tabs (Rosuvastatin Calcium) .... Take 1 Tablet By Mouth Once A Day 2)  Veramyst 27.5 Mcg/spray Susp (Fluticasone Furoate) .... 2 Sprays Per Nostril Daily As Needed 3)  Citalopram Hydrobromide 20 Mg Tabs (Citalopram Hydrobromide) .Marland Kitchen.. 1 Tab By Mouth Qhs 4)  Omeprazole 40 Mg Cpdr (Omeprazole) .Marland Kitchen.. 1 Tab By Mouth Daily, Take 30 Min Before Breakfast 5)  Clonazepam 0.5 Mg Tabs (Clonazepam) .Marland Kitchen.. 1-2 Tabs By Mouth At Bedtime As Needed Anxiety/ Sleep  Allergies (verified): 1)  ! Celebrex 2)  ! Vioxx 3)  ! Codeine 4)  ! Boniva (Ibandronate Sodium) 5)  ! Prednisone  Past History:  Past medical, surgical, family and social histories (including risk factors) reviewed, and no changes noted (except as noted below).  Past Medical History: Reviewed history from 12/26/2009 and no changes required. Allergic rhinitis Colonic polyps, hx of Depression Hyperlipidemia  Osteoarthritis hiatal hernia GERD Hx of Tobacco abuse lung nodule, non cancerous 2010 moderate COPD on PFTs -- Dr Delford Field 09-2008  Past Surgical History: Reviewed history  from 02/01/2009 and no changes required. spinal fusion 1998 Hysterectomy  removal of lung nodule Dr Edwyna Shell 10-2008 with post op lung collapse  Clinical Reports Reviewed:  PFT's:  09/30/2008: DLCO %Predicted:  41 FEF 25/75 %Predicted:  23 FEV1 %Predicted:  75 FVC %Predicted:  121 Post Spirometry FEF 25/75 %Predicted:  22 Post Spirometry FEV1 %Predicted:  70 Post Spirometry FVC %Predicted:  116 RV %Predicted:  140 TLC %Predicted:  128   Family History: Reviewed history from 05/09/2009 and no changes required. Family History Diabetes 1st degree relative Family History High cholesterol Family History Hypertension Breast ca - no Ovarian ca - no Diabetes Dementia - mother; died at 10  Social History: Reviewed history from 08/17/2009 and no changes required. 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  The patient denies shortness of breath at rest, productive cough, non-productive cough, coughing up blood, chest pain, irregular heartbeats, acid heartburn, indigestion, loss of appetite, weight change, abdominal pain, difficulty swallowing, sore throat, tooth/dental problems, headaches, nasal congestion/difficulty breathing through nose, sneezing, itching, ear ache, anxiety, depression, hand/feet swelling, joint stiffness or pain, rash, change in color of mucus, and fever.    Vital Signs:  Patient profile:   65 year old female Height:      62 inches Weight:      122 pounds BMI:     22.39 O2 Sat:      96 % on Room air Temp:     98.4 degrees F oral Pulse rate:   62 / minute BP sitting:   130 / 80  (left arm) Cuff size:   regular  Vitals Entered By: Gweneth Dimitri RN (January 05, 2010 9:36 AM)  O2 Flow:  Room air CC: Pt here for follow up.  She was last seen 09/2008 and states Dr. Cathey Endow requested she f/u.  States breathing is doing well overall.  Denis "SOB on a regular basis," wheezing, chest tightness, cough.  Would like to discuss if she needs to be on spiriva long term. Comments Medications reviewed with patient Daytime contact number verified with patient. Gweneth Dimitri RN  January 05, 2010 9:39 AM    Physical Exam  Additional Exam:  Gen: Pleasant, well-nourished, in no distress,  normal affect ENT: No lesions,  mouth clear,  oropharynx clear, no postnasal drip Neck: No JVD, no TMG, no carotid bruits Lungs: No use of accessory muscles, no dullness to percussion, distant BS Cardiovascular: RRR, heart sounds normal, no murmur or gallops, no peripheral edema Abdomen: soft and NT, no HSM,  BS normal Musculoskeletal: No deformities, no cyanosis or clubbing Neuro: alert, non focal Skin: Warm, no lesions or  rashes    Pulmonary Function Test Date: 01/05/2010 Height (in.): 62 Gender: Female  Pre-Spirometry FVC    Value: 2.62 L/min   Pred: 2.72 L/min     % Pred: 96 % FEV1    Value: 1.23 L     Pred: 1.96 L     % Pred: 62 % FEV1/FVC  Value: 47 %     Pred: 73 %     % Pred: 64 % FEF 25-75  Value: 0.47 L/min   Pred: 2.32 L/min     % Pred: 20 %  Impression & Recommendations:  Problem # 1:  COPD (ICD-496)  Assessment Unchanged ongoing obstructive lung disease,  likely would benefit from long term spiriva to reduce exacerbation rates. Current CXR 11/10 neg for active infiltrate,  the pt has stopped smoking plan resume spiriva no further abx fov 4 months  Medications Added to Medication List This Visit: 1)  Veramyst 27.5 Mcg/spray Susp (Fluticasone furoate) .... 2 sprays per nostril daily as needed 2)  Spiriva Handihaler 18 Mcg Caps (Tiotropium bromide monohydrate) .... Two puffs in handihaler daily  Complete Medication List: 1)  Crestor 10 Mg Tabs (Rosuvastatin calcium) .... Take 1 tablet by mouth once a day 2)  Veramyst 27.5 Mcg/spray Susp (Fluticasone furoate) .... 2 sprays per nostril daily as needed 3)  Citalopram Hydrobromide 20 Mg Tabs (Citalopram hydrobromide) .Marland Kitchen.. 1 tab by mouth qhs 4)  Omeprazole 40 Mg Cpdr (Omeprazole) .Marland Kitchen.. 1 tab by mouth daily, take 30 min before breakfast 5)  Clonazepam 0.5 Mg Tabs (Clonazepam) .Marland Kitchen.. 1-2 tabs by mouth at bedtime as needed anxiety/ sleep 6)  Spiriva Handihaler 18 Mcg Caps (Tiotropium bromide monohydrate) .... Two puffs in handihaler daily  Other Orders: Spirometry w/Graph (94010) Est. Patient Level IV (45409)  Patient Instructions: 1)  Resume Spiriva daily 2)  Return 4 months for recheck Prescriptions: SPIRIVA HANDIHALER 18 MCG  CAPS (TIOTROPIUM BROMIDE MONOHYDRATE) Two puffs in handihaler daily  #30 x 6   Entered and Authorized by:   Storm Frisk MD   Signed by:   Storm Frisk MD on 01/05/2010   Method used:   [facility] to         [facility] [phone]* (retail)       [address]       [place], [place]  [address]       Ph: [phone]       Fax: [phone]   RxID:   [id number]

## 2010-07-04 NOTE — Letter (Signed)
Summary: Patient [facility] Biopsy Results  [facility]  [address] [place], [place] [address]   Phone: [phone]  Fax: [phone]        May 04, 2010 MRN: [id number]    [facility] [address] [place], [place]  [address]    Dear Ms. Russomanno,  I am pleased to inform you that the biopsies taken during your recent endoscopic examination did not show any evidence of cancer upon pathologic examination.  Additional information/recommendations:  __No further action is needed at this time.  Please follow-up with      your primary care physician for your other healthcare needs.  __ Please call [phone] to schedule a return visit to review      your condition.  x__ Continue with the treatment plan as outlined on the day of your      exam.Duodenal biopsies did not show changes of celiac disease. Please continue Nexium for your acid reflux problems. Please schedule office visit followup in one month's time if not already planned. __ You should have a repeat endoscopic examination for this problem              in _ months/years.   Please call us if you are having persistent problems or have questions about your condition that have not been fully answered at this time.  Sincerely,  Mardella Layman MD [facility]  This letter has been electronically signed by your physician.  Appended Document: Patient Notice-Endo Biopsy Results Letter mailed

## 2010-07-04 NOTE — Assessment & Plan Note (Signed)
Summary: Pulmonary OV   Copy to:  Seymour Bars DO Primary Provider/Referring Provider:  Seymour Bars DO  CC:  4 month follow up.  Pt states breathing is unchaged.  States she does have SOB mainly with activity but does not know if this is related to pulmonary or GI issues.  Denies wheezing, chest tightness, and cough..  History of Present Illness: Pulmonary OV     65 yo WF presented 09/2008 with LLL sup segment lung mass PET positive. Ultimately sent to surgery for resection.  Active smoker but quit prior to surgery.  Felt to be high likelihood to be CA. Resection showed necrotizing granulomatous disease.  C/S pos pseudomonas putida.  LNs reactive.  Pt had to return to OR for leak after VATS.       January 05, 2010 9:47 AM Had LLL sup segmentectomy 10/28/08.   pseudomonas putitda was the isolate  Pt then had PTX treated with CT.  Finally improved by 11/10 with normal CXR.   Currently:  some pressure in abdomen,  sl dyspnea with exertiojn.  Was on spiriva which she has stopped several weeks ago.  SHe was uncertain if she noticed any differece on spiriva.  May 11, 2010 10:59 AM Since last OV. had abdominal issues.   no true heartburn.  EGD did show antrum.   Saw GI and had EGD.  Rx folic acid .   Dyspnea is ok.  If not bloated no problems,  if gets bloated gets dyspnea. No chest pain.  No cough. No mucus.   Preventive Screening-Counseling & Management  Alcohol-Tobacco     Alcohol drinks/day: 1     Alcohol type: all     Alcohol Counseling: to STOP drinking     Smoking Status: quit < 6 months     Smoking Cessation Counseling: yes     Packs/Day: 0.5     Year Started: 1967     Year Quit: April 2010     Pack years: 1.2 to 1 PPD x40 yrs     Passive Smoke Exposure: yes     Tobacco Counseling: to quit use of tobacco products  Current Medications (verified): 1)  Crestor 10 Mg Tabs (Rosuvastatin Calcium) .... Take 1 Tablet By Mouth Once A Day 2)  Veramyst 27.5 Mcg/spray Susp  (Fluticasone Furoate) .... 2 Sprays Per Nostril Daily As Needed 3)  Citalopram Hydrobromide 20 Mg Tabs (Citalopram Hydrobromide) .Marland Kitchen.. 1 Tab By Mouth Qhs 4)  Alprazolam 0.5 Mg Tabs (Alprazolam) .Marland Kitchen.. 1-2 Tabs By Mouth At Bedtime As Needed For Sleep/ Anxiety 5)  Spiriva Handihaler 18 Mcg  Caps (Tiotropium Bromide Monohydrate) .... Two Puffs in Handihaler Daily 6)  Nexium 40 Mg Cpdr (Esomeprazole Magnesium) .... One Tablet By Mouth Once Daily 7)  Miralax  Powd (Polyethylene Glycol 3350) .... As Needed 8)  Vear Clock Colon Health  Caps (Probiotic Product) .... Take One By Mouth Two Times A Day, But Over The Counter 9)  Folic Acid 1 Mg Tabs (Folic Acid) .... Take One By Mouth Once Daily  Allergies (verified): 1)  ! Celebrex 2)  ! Vioxx 3)  ! Codeine 4)  ! Boniva (Ibandronate Sodium) 5)  ! Prednisone  Past History:  Past medical, surgical, family and social histories (including risk factors) reviewed, and no changes noted (except as noted below).  Past Medical History: Allergic rhinitis Depression Hx of Tobacco abuse LLL necrotizing granulomatous dz; 10-2008 moderate COPD on PFTs -- Dr Delford Field 09-2008; repeat 01-2010 DIVERTICULOSIS, COLON (ICD-562.10) ANAL PRURITUS (  ICD-698.0) EPIGASTRIC PAIN (ICD-789.06) HIATAL HERNIA (ICD-553.3) GERD (ICD-530.81)    -Gastritis on EGD 11/11 OSTEOARTHRITIS (ICD-715.90) HYPERLIPIDEMIA (ICD-272.4) COLONIC POLYPS, ADENOMATOUS, HX OF (ICD-V12.72) Anxiety Disorder Arthritis  Past Surgical History: Reviewed history from 02/01/2009 and no changes required. spinal fusion 1998 Hysterectomy  removal of lung nodule Dr Edwyna Shell 10-2008 with post op lung collapse  Family History: Reviewed history from 04/07/2010 and no changes required. Family History Diabetes 1st degree relative Family History High cholesterol Family History Hypertension Breast ca - no Ovarian ca - no Diabetes Dementia - mother; died at 33 No FH of Colon Cancer:  Social History: Reviewed  history from 04/07/2010 and no changes required. 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  The patient denies shortness of breath at rest, productive cough, non-productive cough, coughing up blood, chest pain, irregular heartbeats, acid heartburn, indigestion, loss of appetite, weight change, difficulty swallowing, sore throat, tooth/dental problems, headaches, nasal congestion/difficulty breathing through nose, sneezing, itching, ear ache, anxiety, depression, hand/feet swelling, joint stiffness or pain, rash, change in color of mucus, and fever.    Vital Signs:  Patient profile:   65 year old female Height:      62 inches Weight:      120.31 pounds BMI:     22.08 O2 Sat:      96 % on Room air Temp:     97.4 degrees F oral Pulse rate:   75 / minute BP sitting:   130 / 80  (left arm) Cuff size:   regular  Vitals Entered By: Gweneth Dimitri RN (May 11, 2010 10:55 AM)  O2 Flow:  Room air CC: 4 month follow up.  Pt states breathing is unchaged.  States she does have SOB mainly with activity but does not know if this is related to pulmonary or GI issues.  Denies wheezing, chest tightness, cough. Comments Medications reviewed with patient Daytime contact number verified with patient. Gweneth Dimitri RN  May 11, 2010 10:56 AM    Physical Exam  Additional Exam:  Gen: Pleasant, well-nourished, in no distress,  normal affect ENT: No lesions,  mouth clear,  oropharynx clear, no postnasal drip Neck: No JVD, no TMG, no carotid bruits Lungs: No use of accessory muscles, no dullness to percussion, distant BS Cardiovascular: RRR, heart sounds normal, no murmur or gallops, no peripheral edema Abdomen: soft and NT, no HSM,  BS  normal Musculoskeletal: No deformities, no cyanosis or clubbing Neuro: alert, non focal Skin: Warm, no lesions or rashes    Impression & Recommendations:  Problem # 1:  COPD (ICD-496) Assessment Improved stable copd s/p prior lung resection for pseudomonas lung abscess plan cont spiriva   Complete Medication List: 1)  Crestor 10 Mg Tabs (Rosuvastatin calcium) .... Take 1 tablet by mouth once a day 2)  Veramyst 27.5 Mcg/spray Susp (Fluticasone furoate) .... 2 sprays per nostril daily as needed 3)  Citalopram Hydrobromide 20 Mg Tabs (Citalopram hydrobromide) .Marland Kitchen.. 1 tab by mouth qhs 4)  Alprazolam 0.5 Mg Tabs (Alprazolam) .Marland Kitchen.. 1-2 tabs by mouth at bedtime as needed for sleep/ anxiety 5)  Spiriva Handihaler 18 Mcg Caps (Tiotropium bromide monohydrate) .... Two puffs in handihaler daily 6)  Nexium 40 Mg Cpdr (Esomeprazole magnesium) .Marland KitchenMarland KitchenMarland Kitchen  One tablet by mouth once daily 7)  Miralax Powd (Polyethylene glycol 3350) .... As needed 8)  Phillips Colon Health Caps (Probiotic product) .... Take one by mouth two times a day, but over the counter 9)  Folic Acid 1 Mg Tabs (Folic acid) .... Take one by mouth once daily  Other Orders: Est. Patient Level III (30865)  Patient Instructions: 1)  No change in medications 2)  Return in     6     months  [place]  Prescriptions: SPIRIVA HANDIHALER 18 MCG  CAPS (TIOTROPIUM BROMIDE MONOHYDRATE) Two puffs in handihaler daily  #30 x 6   Entered and Authorized by:   Storm Frisk MD   Signed by:   Storm Frisk MD on 05/11/2010   Method used:   Electronically to        [facility] [phone]* (retail)       [address]       [place], [place]  [address]       Ph: [phone]       Fax: [phone]   RxID:   [id number]

## 2010-07-04 NOTE — Assessment & Plan Note (Signed)
Summary: pruritis ani   Vital Signs:  Patient profile:   65 year old female Height:      62 inches Weight:      118 pounds BMI:     21.66 O2 Sat:      98 % on Room air Pulse rate:   69 / minute BP sitting:   116 / 70  (left arm) Cuff size:   regular  Vitals Entered By: Payton Spark CMA (September 27, 2009 9:48 AM)  O2 Flow:  Room air CC: Burning and itching around external vaginia and bottom. ? hemorrhoids.   Primary Care Provider:  Seymour Bars DO  CC:  Burning and itching around external vaginia and bottom. ? hemorrhoids..  History of Present Illness: 65 yo WF presents for burning and itching around her perineum on and off x 3 wks.  Worse this week.  Around her vagina and her rectum.  She has alterning diarrhea and constipation.  She tried preparation H cream and wipes which helped some.  She has not felt any hemorrhoids.  Denies any pain with defecation or blood in her stools.  Denies dysuria.  Denies any vaginal discharge.  She has not seen an external rash.  Allergies: 1)  ! Celebrex 2)  ! Vioxx 3)  ! Codeine 4)  ! Boniva (Ibandronate Sodium) 5)  ! Prednisone  Past History:  Past Medical History: Reviewed history from 02/01/2009 and no changes required. Allergic rhinitis Colonic polyps, hx of Depression Hyperlipidemia  Osteoarthritis hiatal hernia GERD Hx of Tobacco abuse lung nodule, non cancerous 2010  Social History: Reviewed history from 08/17/2009 and no changes required. 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   Head:  normocephalic and atraumatic.   Lungs:  Normal respiratory effort, chest expands symmetrically. Lungs are clear to auscultation, no crackles or wheezes. Heart:  Normal rate and regular rhythm. S1 and  S2 normal without gallop, murmur, click, rub or other extra sounds. Rectal:  hemorrhoidal skin tags only Genitalia:  no vag discharge, bleeding, lesions or edema Skin:  hyperpigmented confluent rash over the perineum and labia majora and minora. Psych:  good eye contact, not anxious appearing, and not depressed appearing.     Impression & Recommendations:  Problem # 1:  ANAL PRURITUS (ICD-698.0) Secondary to hemorrhoidal skin tags and alternating diarrhea/ constipation. Treat itching with topical steroid ointment x 1 wk followed by topical aquaphor. Use moist wipes with BMs and use fiber supplement to keep stools soft but not loose. Avoid excess soap in the shower.  Call if not improved in 1 wk.  Complete Medication List: 1)  Crestor 10 Mg Tabs (Rosuvastatin calcium) .... Take 1 tablet by mouth once a day 2)  Zyrtec Allergy 10 Mg Tabs (Cetirizine hcl) .Marland Kitchen.. 1 tab by mouth qpm 3)  Veramyst 27.5 Mcg/spray Susp (Fluticasone furoate) .... 2 sprays per nostril daily 4)  Spiriva Handihaler 18 Mcg Caps (Tiotropium bromide monohydrate) .... Two puffs in handihaler daily 5)  Triamcinolone Acetonide 0.1 % Oint (Triamcinolone acetonide) .... Apply to perineal rash two times a day x 1 wk  Patient Instructions: 1)  Use RX steroid ointment 2 x a day x 7 days. 2)  Use moist wipes with each BM. 3)  Try to keep stools soft  but not loose.  Use Benefiber if needed. 4)  After 7 days, change to Aquaphor to protect skin. 5)  If not improved in 10 days, please call. Prescriptions: TRIAMCINOLONE ACETONIDE 0.1 % OINT (TRIAMCINOLONE ACETONIDE) apply to perineal rash two times a day x 1 wk  #15 g x 0   Entered and Authorized by:   Seymour Bars DO   Signed by:   Seymour Bars DO on 09/27/2009   Method used:   Electronically to        [facility] [phone]* (retail)       [address]       [place], [place]  [address]       Ph: [phone]       Fax: [phone]   RxID:    [id number]

## 2010-07-04 NOTE — Letter (Signed)
Summary: EGD Instructions  [facility]  [address] [place], [place] [address]   Phone: [phone]  Fax: [phone]       Linda Rangel    Dec 20, 1945    MRN: [id number]       Procedure Day /Date: 05/01/2010 Monday     Arrival Time:  2:30pm     Procedure Time: 3:30pm     Location of Procedure:                    [facility] (4th Floor)   PREPARATION FOR ENDOSCOPY   On 05/01/2010 THE DAY OF THE PROCEDURE:  1.   No solid foods, milk or milk products are allowed after midnight the night before your procedure.  2.   Do not drink anything colored red or purple.  Avoid juices with pulp.  No orange juice.  3.  You may drink clear liquids until 1:30am, which is 2 hours before your procedure.                                                                                                CLEAR LIQUIDS INCLUDE: Water Jello Ice Popsicles Tea (sugar ok, no milk/cream) Powdered fruit flavored drinks Coffee (sugar ok, no milk/cream) Gatorade Juice: apple, white grape, white cranberry  Lemonade Clear bullion, consomm, broth Carbonated beverages (any kind) Strained chicken noodle soup Hard Candy   MEDICATION INSTRUCTIONS  Unless otherwise instructed, you should take regular prescription medications with a small sip of water as early as possible the morning of your procedure.                 OTHER INSTRUCTIONS  You will need a responsible adult at least 66 years of age to accompany you and drive you home.   This person must remain in the waiting room during your procedure.  Wear loose fitting clothing that is easily removed.  Leave jewelry and other valuables at home.  However, you may wish to bring a book to read or an iPod/MP3 player to listen to music as you wait for your procedure to start.  Remove all body piercing jewelry and leave at home.  Total time from sign-in until discharge is approximately 2-3 hours.  You should go  home directly after your procedure and rest.  You can resume normal activities the day after your procedure.  The day of your procedure you should not:   Drive   Make legal decisions   Operate machinery   Drink alcohol   Return to work  You will receive specific instructions about eating, activities and medications before you leave.    The above instructions have been reviewed and explained to me by   _______________________    I fully understand and can verbalize these instructions _____________________________ Date _________

## 2010-07-04 NOTE — Assessment & Plan Note (Signed)
Summary: f/u mood/ gerd   Vital Signs:  Patient profile:   65 year old female Height:      62 inches Weight:      120 pounds BMI:     22.03 O2 Sat:      95 % on Room air Pulse rate:   68 / minute BP sitting:   113 / 79  (left arm) Cuff size:   regular  Vitals Entered By: Payton Spark CMA (March 27, 2010 9:58 AM)  O2 Flow:  Room air CC: F/U.    Primary Care Provider:  Seymour Bars DO  CC:  F/U. Marland Kitchen  History of Present Illness: 65 yo WF presents for f/u depression.  At her last visit, we went up on her Citalopram from 10--> 20 mg/ day.  She feels no different.  She is taking Klonopin  at night but she has a hangover SE.    She went back to Dr Delford Field for Spirometry/ f/u COPD in August and was told to stay on Spiriva long term.  Got flu shot today.  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    She has a GI f/u appt on 11-4 for continued epigastric pain.  She is taking Omeprazole 40 mg / day.  She continues to c/o constipation.      Allergies: 1)  ! Celebrex 2)  ! Vioxx 3)  ! Codeine 4)  ! Boniva (Ibandronate Sodium) 5)  ! Prednisone  Past History:  Past Medical History: Allergic rhinitis Colonic polyps, hx of Depression Hyperlipidemia  Osteoarthritis hiatal hernia GERD Hx of Tobacco abuse LLL necrotizing granulomatous dz; 10-2008 moderate COPD on PFTs -- Dr Delford Field 09-2008; repeat 01-2010  Past Surgical History: Reviewed history from 02/01/2009 and no changes required. spinal fusion 1998 Hysterectomy  removal of lung nodule Dr Edwyna Shell 10-2008 with post op lung collapse  Social History: Reviewed history from 08/17/2009 and no changes required. 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   Head:   normocephalic and atraumatic.   Mouth:  pharynx pink and moist.   Neck:  no masses.   Lungs:  Normal respiratory effort, chest expands symmetrically. Lungs are clear to auscultation, no crackles or wheezes. Heart:  Normal rate and regular rhythm. S1 and S2 normal without gallop, murmur, click, rub or other extra sounds. Abdomen:  mild epigastric TTP with vol guarding.  abdomen feels 'full' with stool in the R and L colon.  ND.   Skin:  color normal.   Psych:  good eye contact, not anxious appearing, and flat affect.     Impression & Recommendations:  Problem # 1:  DEPRESSION (ICD-311) Assessment Unchanged Will continue Citalopram daily. Change clonazepam to Alprazolam at night due to hangover SE. Declined counseling.  I do think that getting her more active - both socially and physically would improve her mood greatly.  She is bored at home by herself.  I advised looking into joining a gym.  She needs exercise anyway and this woiuld help her meet people and help w/ her mood. Her updated medication list for this problem includes:    Citalopram Hydrobromide 20 Mg Tabs (Citalopram hydrobromide) .Marland Kitchen... 1 tab by mouth qhs    Alprazolam 0.5 Mg Tabs (Alprazolam) .Marland KitchenMarland KitchenMarland KitchenMarland Kitchen  1-2 tabs by mouth at bedtime as needed for sleep/ anxiety  Problem # 2:  GERD (ICD-530.81) I have her samples of Nexium today to try instead of her Omeprazole.  Her chronic constipation may be making this worse.  I asked her to add Miralax daily to get her bowels moving.  She has f/u with GI in the next 2 wks. Her updated medication list for this problem includes:    Omeprazole 40 Mg Cpdr (Omeprazole) .Marland Kitchen... 1 tab by mouth daily, take 30 min before breakfast  Problem # 3:  COPD (ICD-496) Managed by Dr Delford Field.  I reviewed her recent notes.  She had her flu shot and is back to using [place] everyday. Her updated medication list for this problem includes:    Spiriva Handihaler 18 Mcg Caps (Tiotropium bromide monohydrate) .Marland Kitchen..Marland Kitchen Two  puffs in handihaler daily  Complete Medication List: 1)  Crestor 10 Mg Tabs (Rosuvastatin calcium) .... Take 1 tablet by mouth once a day 2)  Veramyst 27.5 Mcg/spray Susp (Fluticasone furoate) .... 2 sprays per nostril daily as needed 3)  Citalopram Hydrobromide 20 Mg Tabs (Citalopram hydrobromide) .Marland Kitchen.. 1 tab by mouth qhs 4)  Omeprazole 40 Mg Cpdr (Omeprazole) .Marland Kitchen.. 1 tab by mouth daily, take 30 min before breakfast 5)  Alprazolam 0.5 Mg Tabs (Alprazolam) .Marland Kitchen.. 1-2 tabs by mouth at bedtime as needed for sleep/ anxiety 6)  Spiriva Handihaler 18 Mcg Caps (Tiotropium bromide monohydrate) .... Two puffs in handihaler daily  Other Orders: Admin 1st Vaccine (23762) Flu Vaccine 15yrs + [phone])  Patient Instructions: 1)  Change Klonopin at night to Alprazolam which is shorter acting. 2)  Take for sleep/ anxiety. 3)  Stay on Citalopram. 4)  Start regular exercise program. 5)  Change Omeprazole to Nexium - 1 capsule daily until you see Dr Jarold Motto. 6)  Call if any problems. 7)  Try MIRALAX daily for constipation until bowels regulate. 8)  Return for follow up in 3 mos. Prescriptions: ALPRAZOLAM 0.5 MG TABS (ALPRAZOLAM) 1-2 tabs by mouth at bedtime as needed for sleep/ anxiety  #40 x 0   Entered and Authorized by:   Seymour Bars DO   Signed by:   Seymour Bars DO on 03/27/2010   Method used:   Printed then faxed to ...       [facility] [phone][facility] (retail)       [address]       [place], [place]  [address]       Ph: [phone]       Fax: [phone]   RxID:   [id number]    Orders Added: 1)  Admin 1st Vaccine [90471] 2)  Flu Vaccine 50yrs + [10175] 3)  Est. Patient Level IV [10258] Flu Vaccine Consent Questions     Do you have a history of severe allergic reactions to this vaccine? no    Any prior history of allergic reactions to egg and/or gelatin? no    Do you have a sensitivity to the preservative Thimersol? no    Do you have a past history of  Guillan-Barre Syndrome? no    Do you currently have an acute febrile illness? no    Have you ever had a severe reaction to latex? no    Vaccine information given and explained to patient? yes    Are you currently pregnant? no    Lot Number:[id number]   Exp Date:12/02/2010   Site Given  Left Deltoid IMmin 1st  Vaccine [90471] 2)  Flu Vaccine 63yrs + [04540]     .lbflu

## 2010-07-04 NOTE — Assessment & Plan Note (Signed)
Summary: follow up gerd and hiatal hernia/yf   History of Present Illness Visit Type: consult  Primary GI MD: Sheryn Bison MD FACP FAGA Primary Provider: Seymour Bars DO Requesting Provider: Seymour Bars DO Chief Complaint: Constipation, GERD, and bloating  History of Present Illness:   65 year old Caucasian female former smoker who underwent left lower lobe thoracotomy and excision of a benign lung tumor by Dr. Edwyna Shell in the spring. She subsequently had a pneumothorax with chest tube insertion. She currently is asymptomatic except for dyspnea on exertion and some mild chest pain with exertion. She denies a history of known coronary artery disease her up for a full vascular disease.  She had acid reflux for many years but now has worsening pressure and discomfort in her subxiphoid area without dysphasia. 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 She denies a history of known gallbladder or liver disease, and denies any hepatobiliary complaints. She currently is on Nexium 40 mg a day and daily MiraLax. She had mild anorexia and weight loss but no other systemic complaints.   GI Review of Systems    Reports acid reflux, bloating, and  heartburn.      Denies abdominal pain, belching, chest pain, dysphagia with liquids, dysphagia with solids, loss of appetite, nausea, vomiting, vomiting blood, weight loss, and  weight gain.      Reports constipation.     Denies anal fissure, black tarry stools, change in bowel habit, diarrhea, diverticulosis, fecal incontinence, heme positive stool, hemorrhoids, irritable bowel syndrome, jaundice, light color stool, liver problems, rectal bleeding, and  rectal pain.    Current Medications (verified): 1)  Crestor 10 Mg Tabs (Rosuvastatin Calcium) .... Take 1 Tablet By Mouth  Once A Day 2)  Veramyst 27.5 Mcg/spray Susp (Fluticasone Furoate) .... 2 Sprays Per Nostril Daily As Needed 3)  Citalopram Hydrobromide 20 Mg Tabs (Citalopram Hydrobromide) .Marland Kitchen.. 1 Tab By Mouth Qhs 4)  Alprazolam 0.5 Mg Tabs (Alprazolam) .Marland Kitchen.. 1-2 Tabs By Mouth At Bedtime As Needed For Sleep/ Anxiety 5)  Spiriva Handihaler 18 Mcg  Caps (Tiotropium Bromide Monohydrate) .... Two Puffs in Handihaler Daily 6)  Nexium 40 Mg Cpdr (Esomeprazole Magnesium) .... One Tablet By Mouth Once Daily 7)  Miralax  Powd (Polyethylene Glycol 3350) .... As Needed  Allergies (verified): 1)  ! Celebrex 2)  ! Vioxx 3)  ! Codeine 4)  ! Boniva (Ibandronate Sodium) 5)  ! Prednisone  Past History:  Past medical, surgical, family and social histories (including risk factors) reviewed for relevance to current acute and chronic problems.  Past Medical History: Allergic rhinitis Depression Hx of Tobacco abuse LLL necrotizing granulomatous dz; 10-2008 moderate COPD on PFTs -- Dr Delford Field 09-2008; repeat 01-2010 DIVERTICULOSIS, COLON (ICD-562.10) ANAL PRURITUS (ICD-698.0) EPIGASTRIC PAIN (ICD-789.06) HIATAL HERNIA (ICD-553.3) GERD (ICD-530.81) OSTEOARTHRITIS (ICD-715.90) HYPERLIPIDEMIA (ICD-272.4) COLONIC POLYPS, ADENOMATOUS, HX OF (ICD-V12.72) Anxiety Disorder Arthritis  Past Surgical History: Reviewed history from 02/01/2009 and no changes required. spinal fusion 1998 Hysterectomy  removal of lung nodule Dr Edwyna Shell 10-2008 with post op lung collapse  Family History: Reviewed history from 05/09/2009 and no changes required. Family History Diabetes 1st degree relative Family History High cholesterol Family History Hypertension Breast ca - no Ovarian ca - no Diabetes Dementia - mother; died at 98 No FH of Colon Cancer:  Social History: Reviewed history from 08/17/2009 and no changes required. Retired: Field seismologist  Production designer, theatre/television/film of a [employer] Married (2nd marriage 14 yrs) 2 children and 1 step child  7  grandchildren Quit smoking 10-02-2008 Alcohol use-yes Daily Caffeine Use: 2 daily   Review of Systems       The patient complains of allergy/sinus, anxiety-new, arthritis/joint pain, cough, depression-new, fatigue, and shortness of breath.  The patient denies anemia, back pain, blood in urine, breast changes/lumps, change in vision, confusion, coughing up blood, fainting, fever, headaches-new, hearing problems, heart murmur, heart rhythm changes, itching, menstrual pain, muscle pains/cramps, night sweats, nosebleeds, pregnancy symptoms, skin rash, sleeping problems, sore throat, swelling of feet/legs, swollen lymph glands, thirst - excessive , urination - excessive , urination changes/pain, urine leakage, vision changes, and voice change.   CV:  Denies chest pains, angina, palpitations, syncope, dyspnea on exertion, orthopnea, PND, peripheral edema, and claudication. Resp:  Complains of dyspnea with exercise, cough, and pleurisy. GI:  Complains of indigestion/heartburn, abdominal pain, gas/bloating, and constipation; denies difficulty swallowing, pain on swallowing, nausea, vomiting, vomiting blood, jaundice, diarrhea, change in bowel habits, bloody BM's, black BMs, and fecal incontinence. Neuro:  Denies weakness, paralysis, abnormal sensation, seizures, syncope, tremors, vertigo, transient blindness, frequent falls, frequent headaches, difficulty walking, headache, sciatica, radiculopathy other:, restless legs, memory loss, and confusion. Psych:  Denies depression, anxiety, memory loss, suicidal ideation, hallucinations, paranoia, phobia, and confusion. Heme:  Denies bruising, bleeding, enlarged lymph nodes, and pagophagia.  Vital Signs:  Patient profile:   65 year old female Height:      62 inches Weight:      122 pounds BMI:     22.39 BSA:     1.55 Pulse rate:   68 / minute Pulse rhythm:   regular BP sitting:   126 / 64  (left arm) Cuff size:   regular  Vitals Entered By: Ok Anis CMA  (April 07, 2010 9:12 AM)  Physical Exam  General:  Well developed, well nourished, no acute distress.healthy appearing.  healthy appearing.   Head:  Normocephalic and atraumatic. Eyes:  PERRLA, no icterus.exam deferred to patient's ophthalmologist.  exam deferred to patient's ophthalmologist.   Lungs:  decreased BS on L.  decreased BS on L.   Heart:  Regular rate and rhythm; no murmurs, rubs,  or bruits. Abdomen:  Soft, nontender and nondistended. No masses, hepatosplenomegaly or hernias noted. Normal bowel sounds. Palpable left hepatic lobe which is slightly tender. No other abdominal masses noted. Bowel sounds are nonobstructive and I cannot appreciate an abdominal bruit. Rectal:  deferred. Msk:  Symmetrical with no gross deformities. Normal posture. Extremities:  No clubbing, cyanosis, edema or deformities noted. Neurologic:  Alert and  oriented x4;  grossly normal neurologically. Psych:  Alert and cooperative. Normal mood and affect.   Impression & Recommendations:  Problem # 1:  CONSTIPATION (ICD-564.00) Assessment Deteriorated Anatomical tortuous and redundant colon with severe diverticulosis. I've encouraged her to continue with fiber as tolerated with liberal p.o. fluids, p.r.n. MiraLax, and we will try [employer] tabs twice a day. She is to continue her colonoscopy followup as per clinical protocol. Orders: TLB-CBC Platelet - w/Differential (85025-CBCD) TLB-BMP (Basic Metabolic Panel-BMET) (80048-METABOL) TLB-Hepatic/Liver Function Pnl (80076-HEPATIC) TLB-TSH (Thyroid Stimulating Hormone) (84443-TSH) TLB-B12, Serum-Total ONLY ([address]-U04) TLB-Ferritin (82728-FER) TLB-Folic Acid (Folate) (82746-FOL) TLB-IBC Pnl (Iron/FE;Transferrin) (83550-IBC) TLB-Amylase (82150-AMYL) TLB-Lipase (83690-LIPASE) T-igA (54098) T-Sprue Panel (Celiac Disease Aby Eval) (83516x3/[address]) Colon/Endo (Colon/Endo)  Problem # 2:  DIVERTICULOSIS, COLON (ICD-562.10) Assessment:  Unchanged  Problem # 3:  EPIGASTRIC PAIN (ICD-789.06) Assessment: Unchanged Probable enlarging hiatal hernia related to  recent chest surgery---rule out candidal esophagitis or other anatomical abnormalities. Her last endoscopy was 10 years ago. I have also ordered upper abdominal ultrasound exam or examination and exclude cholelithiasis and for hepatic examination. Labs also been ordered for review. She is to continue antireflux maneuvers with Nexium 40 mg a day as tolerated Orders TLB-CBC Platelet - w/Differential (85025-CBCD) TLB-BMP (Basic Metabolic Panel-BMET) (80048-METABOL) TLB-Hepatic/Liver Function Pnl (80076-HEPATIC) TLB-TSH (Thyroid Stimulating Hormone) (84443-TSH) TLB-B12, Serum-Total ONLY (04540-J81) TLB-Ferritin (82728-FER) TLB-Folic Acid (Folate) (82746-FOL) TLB-IBC Pnl (Iron/FE;Transferrin) (83550-IBC) TLB-Amylase (82150-AMYL) TLB-Lipase (83690-LIPASE) T-igA (19147) T-Sprue Panel (Celiac Disease Aby Eval) (83516x3/[address]) Colon/Endo (Colon/Endo) Ultrasound Abdomen (UAS)  Problem # 4:  OSTEOPOROSIS (ICD-733.00) Assessment: Comment Only  Problem # 5:  COLONIC POLYPS, ADENOMATOUS, HX OF (ICD-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  Other Orders: EGD (EGD)  Patient Instructions: 1)  Copy sent to : Seymour Bars DO 2)  [facility] Patient Information Guide given to patient.  3)  Upper Endoscopy brochure given.  4)  Your procedure has been scheduled for 05/01/2010, please follow the seperate instructions.  5)  Your abdominal ultrasound is scheduled for 04/11/2010, please follow the seperate instructions.  6)  Avoid foods high in acid content ( tomatoes, citrus juices, spicy foods) . Avoid eating within 3 to 4 hours of lying down or before exercising. Do not over eat; try smaller more frequent meals. Elevate head of bed four inches when sleeping.  7)  Diet should be high in fiber ( fruits, vegetables, whole grains) but low in  residue. Drink at least eight (8) glasses of water a day.  8)  Conscious Sedation brochure given.  9)  Please go to the basement today for your labs.  10)  The medication list was reviewed and reconciled.  All changed / newly prescribed medications were explained.  A complete medication list was provided to the patient / caregiver. Prescriptions: NEXIUM 40 MG CPDR (ESOMEPRAZOLE MAGNESIUM) one tablet by mouth once daily  #30 x 11   Entered by:   Harlow Mares CMA (AAMA)   Authorized by:   Mardella Layman MD [facility]   Signed by:   Harlow Mares CMA (AAMA) on 04/07/2010   Method used:   [facility]        [facility] [phone][facility] (retail)       [address]       [place], [place]  [address]       Ph: [phone]       Fax: [phone]   RxID:   [id number]

## 2010-07-04 NOTE — Procedures (Signed)
Summary: Endoscopy/[facility]  Endoscopy/[facility]   Imported By: Sherian Rein 04/07/2010 13:29:02  _____________________________________________________________________  External Attachment:    Type:   Image     Comment:   External Document

## 2010-07-04 NOTE — Miscellaneous (Signed)
Summary: clotest  Clinical Lists Changes  Problems: Added new problem of GASTRITIS (ICD-535.50) Orders: Added new Test order of TLB-H Pylori Screen Gastric Biopsy (83013-CLOTEST) - Signed 

## 2010-07-04 NOTE — Assessment & Plan Note (Signed)
Summary: f/u epigastric pain   Vital Signs:  Patient profile:   65 year old female Height:      62 inches Weight:      117 pounds BMI:     21.48 O2 Sat:      100 % on Room air Pulse rate:   78 / minute BP sitting:   115 / 75  (left arm) Cuff size:   regular  Vitals Entered By: Payton Spark CMA (August 31, 2009 1:05 PM)  O2 Flow:  Room air CC: F/U. Reflux is getting better but still has upper GI pressure.   Primary Care Provider:  Seymour Bars DO  CC:  F/U. Reflux is getting better but still has upper GI pressure.Marland Kitchen  History of Present Illness: 65 yo WF presents for f/u epigastric pain and dyspepsia.  She started on Dexilant samples once a day 2 wks ago and she did feel better as far as the reflux.  Denies any nausea, melena or hematochezia.  She still has some intermittent sharp epigastric pain.  She had some constipation which has improved.  She has tried eating less dairy.  She admits to some poor dietary habits.  She has hx of a hiatal hernia.  She was last scoped (EGD) about 12 yrs ago.  Last colonoscopy was last year.    Allergies: 1)  ! Celebrex 2)  ! Vioxx 3)  ! Codeine 4)  ! Boniva (Ibandronate Sodium) 5)  ! Prednisone  Review of Systems      See HPI  Physical Exam  General:  alert, well-developed, well-nourished, and well-hydrated.   Head:  normocephalic and atraumatic.   Eyes:  sclera non icteric Mouth:  pharynx pink and moist.   Neck:  no masses.   Lungs:  Normal respiratory effort, chest expands symmetrically. Lungs are clear to auscultation, no crackles or wheezes. Heart:  Normal rate and regular rhythm. S1 and S2 normal without gallop, murmur, click, rub or other extra sounds. Abdomen:  soft.  slight epigastric TTP with vol guarding.  neg murphys sign.  non distended.  no HSM.  NABS. Skin:  color normal.   Cervical Nodes:  No lymphadenopathy noted Psych:  good eye contact, not anxious appearing, and not depressed appearing.     Impression &  Recommendations:  Problem # 1:  EPIGASTRIC PAIN (ICD-789.06) Improved some but still has some pain, less dyspepsia. continue dexialnt samples x 15 days. If not 100%, will test for H. pylori and /or refer to GI for EGD.    Complete Medication List: 1)  Crestor 10 Mg Tabs (Rosuvastatin calcium) .... Take 1 tablet by mouth once a day 2)  Zyrtec Allergy 10 Mg Tabs (Cetirizine hcl) .Marland Kitchen.. 1 tab by mouth qpm 3)  Veramyst 27.5 Mcg/spray Susp (Fluticasone furoate) .... 2 sprays per nostril daily 4)  Spiriva Handihaler 18 Mcg Caps (Tiotropium bromide monohydrate) .... Two puffs in handihaler daily  Patient Instructions: 1)  Stay on Dexilant, 1 capsule by mouth daily. 2)  Work on high [employer], small meals during the day, plenty of water. 3)  If epigastric pain is not improving in 2 more wks, call me to get urease breath test downstairs for H. Pylori.

## 2010-07-04 NOTE — Letter (Signed)
Summary: New Patient letter  [facility]  [address] [place], [place] [address]   Phone: [phone]  Fax: [phone]       12/26/2009 MRN: [id number]  [facility] [address] [place], [place]  [address]  Dear Ms. Kitch,  Welcome to the Gastroenterology Division at [facility].    You are scheduled to see Dr.  Jarold Motto on 02-07-10 at 9:30am on the 3rd floor at [facility], [address]  We ask that you try to arrive at our office 15 minutes prior to your appointment time to allow for check-in.  We would like you to complete the enclosed self-administered evaluation form prior to your visit and bring it with you on the day of your appointment.  We will review it with you.  Also, please bring a complete list of all your medications or, if you prefer, bring the medication bottles and we will list them.  Please bring your insurance card so that we may make a copy of it.  If your insurance requires a referral to see a specialist, please bring your referral form from your primary care physician.  Co-payments are due at the time of your visit and may be paid by cash, check or credit card.     Your office visit will consist of a consult with your physician (includes a physical exam), any laboratory testing he/she may order, scheduling of any necessary diagnostic testing (e.g. x-ray, ultrasound, CT-scan), and scheduling of a procedure (e.g. Endoscopy, Colonoscopy) if required.  Please allow enough time on your schedule to allow for any/all of these possibilities.    If you cannot keep your appointment, please call [phone] to cancel or reschedule prior to your appointment date.  This allows [place] the opportunity to schedule an appointment for another patient in need of care.  If you do not cancel or reschedule by 5 p.m. the business day prior to your appointment date, you will be charged a $50.00 late cancellation/no-show fee.    Thank you for choosing  [facility] for your medical needs.  We appreciate the opportunity to care for you.  Please visit [place] at our website  to learn more about our practice.                     Sincerely,                                                             The Gastroenterology Division

## 2010-07-06 ENCOUNTER — Ambulatory Visit (INDEPENDENT_AMBULATORY_CARE_PROVIDER_SITE_OTHER): Payer: 59 | Admitting: Gastroenterology

## 2010-07-06 ENCOUNTER — Encounter: Payer: Self-pay | Admitting: Gastroenterology

## 2010-07-06 ENCOUNTER — Ambulatory Visit: Admit: 2010-07-06 | Payer: Self-pay | Admitting: Gastroenterology

## 2010-07-06 DIAGNOSIS — K589 Irritable bowel syndrome without diarrhea: Secondary | ICD-10-CM

## 2010-07-06 NOTE — Assessment & Plan Note (Signed)
Summary: F/U TO EGD...LSW.   History of Present Illness Visit Type: Follow-up Visit Primary GI MD: Sheryn Bison MD Faith Rogue Primary Provider: Seymour Bars DO Requesting Provider: na Chief Complaint: Post EGD, patient feeling some improvement History of Present Illness:   Patient continued with vague epigastric discomfort, gas and bloating and early satiety with mild constipation despite probiotics, folic acid supplementation, and MiraLax. She has atypical acid reflux control with PPI therapy. She recently completed endoscopy  that was  unremarkable. Small bowel biopsy did not show celiac disease, but she had a markedly elevated IgG anti-gliadin antibody, and is on a gluten restricted diet, again with questionable response.  Ultrasound also was performed and was unremarkable. She has COPD and quit smoking in May of 2010.Marland Kitchen She has not had previous abdominal surgery but has had hysterectomy.   GI Review of Systems    Reports abdominal pain and  bloating.     Location of  Abdominal pain: upper abdomen.    Denies acid reflux, belching, chest pain, dysphagia with liquids, dysphagia with solids, heartburn, loss of appetite, nausea, vomiting, vomiting blood, weight loss, and  weight gain.      Reports constipation.     Denies anal fissure, black tarry stools, change in bowel habit, diarrhea, diverticulosis, fecal incontinence, heme positive stool, hemorrhoids, irritable bowel syndrome, jaundice, light color stool, liver problems, rectal bleeding, and  rectal pain.    Current Medications (verified): 1)  Crestor 10 Mg Tabs (Rosuvastatin Calcium) .... Take 1 Tablet By Mouth Once A Day 2)  Veramyst 27.5 Mcg/spray Susp (Fluticasone Furoate) .... 2 Sprays Per Nostril Daily As Needed 3)  Citalopram Hydrobromide 20 Mg Tabs (Citalopram Hydrobromide) .Marland Kitchen.. 1 Tab By Mouth Qhs 4)  Alprazolam 0.5 Mg Tabs (Alprazolam) .Marland Kitchen.. 1-2 Tabs By Mouth At Bedtime As Needed For Sleep/ Anxiety 5)  Spiriva Handihaler 18  Mcg  Caps (Tiotropium Bromide Monohydrate) .... Two Puffs in Handihaler Daily 6)  Nexium 40 Mg Cpdr (Esomeprazole Magnesium) .... One Tablet By Mouth Once Daily 7)  Miralax  Powd (Polyethylene Glycol 3350) .... As Needed 8)  Vear Clock Colon Health  Caps (Probiotic Product) .... Take One By Mouth Two Times A Day, But Over The Counter 9)  Folic Acid 1 Mg Tabs (Folic Acid) .... Take One By Mouth Once Daily  Allergies (verified): 1)  ! Celebrex 2)  ! Vioxx 3)  ! Codeine 4)  ! Boniva (Ibandronate Sodium) 5)  ! Prednisone  Past History:  Past medical, surgical, family and social histories (including risk factors) reviewed for relevance to current acute and chronic problems.  Past Medical History: Reviewed history from 05/11/2010 and no changes required. Allergic rhinitis Depression Hx of Tobacco abuse LLL necrotizing granulomatous dz; 10-2008 moderate COPD on PFTs -- Dr Delford Field 09-2008; repeat 01-2010 DIVERTICULOSIS, COLON (ICD-562.10) ANAL PRURITUS (ICD-698.0) EPIGASTRIC PAIN (ICD-789.06) HIATAL HERNIA (ICD-553.3) GERD (ICD-530.81)    -Gastritis on EGD 11/11 OSTEOARTHRITIS (ICD-715.90) HYPERLIPIDEMIA (ICD-272.4) COLONIC POLYPS, ADENOMATOUS, HX OF (ICD-V12.72) Anxiety Disorder Arthritis  Past Surgical History: Reviewed history from 02/01/2009 and no changes required. spinal fusion 1998 Hysterectomy  removal of lung nodule Dr Edwyna Shell 10-2008 with post op lung collapse  Family History: Reviewed history from 04/07/2010 and no changes required. Family History Diabetes 1st degree relative Family History High cholesterol Family History Hypertension Breast ca - no Ovarian ca - no Diabetes Dementia - mother; died at 69 No FH of Colon Cancer:  Social History: Reviewed history from 04/07/2010 and no changes required. 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  The patient denies anemia, anxiety-new, arthritis/joint pain, back pain, blood in urine, breast changes/lumps, change in vision, confusion, coughing up blood, depression-new, fainting, fatigue, fever, headaches-new, hearing problems, heart murmur, heart rhythm changes, itching, menstrual pain, muscle pains/cramps, night sweats, nosebleeds, pregnancy symptoms, skin rash, sleeping problems, sore throat, swelling of feet/legs, thirst - excessive, urination - excessive, urination changes/pain, urine leakage, vision changes, and voice change.    Vital Signs:  Patient profile:   65 year old female Height:      62 inches Weight:      121.38 pounds BMI:     22.28 Pulse rate:   60 / minute Pulse rhythm:   regular BP sitting:   118 / 82  (left arm) Cuff size:   regular  Vitals Entered By: June McMurray CMA Duncan Dull) (May 16, 2010 9:57 AM)  Physical Exam  General:  Well developed, well nourished, no acute distress.healthy appearing.  healthy appearing.   Head:  Normocephalic and atraumatic. Eyes:  PERRLA, no icterus.exam deferred to patient's ophthalmologist.  exam deferred to patient's ophthalmologist.   Abdomen:  Soft, nontender and nondistended. No masses, hepatosplenomegaly or hernias noted. Normal bowel sounds.No distention noted and bowel sounds are normal. I cannot appreciate a secussion splash in the epigastric area. Psych:  Alert and cooperative. Normal mood and affect.   Impression & Recommendations:  Problem # 1:  IRRITABLE BOWEL SYNDROME (ICD-564.1) Assessment Improved Symptoms and workup most consistent with constipation predominant IBS and mild diverticulosis coli. A lot of her symptoms are suggestive of delayed gastric emptying, and gastric emptying scan has been scheduled. Other considerations would be chronic bacterial overgrowth syndrome and she may require  therapy with Xifaxan. She does have associated folic  acid deficiency and is on folate replacement. We will continue Nexium, MiraLax, and probiotic therapy for now. Orders: Gastric Emptying Scan (GES)  Problem # 2:  EPIGASTRIC PAIN (ICD-789.06) Assessment: Unchanged  Problem # 3:  COPD (ICD-496) Assessment: Unchanged  Problem # 4:  COLONIC POLYPS, ADENOMATOUS, HX OF (ICD-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  Patient Instructions: 1)  Copy sent to : Seymour Bars DO 2)  Your Gastic Empty Scan is scheduled for 06/12/2010, please follow the seperate instructions 3)  Please schedule a follow-up appointment in 1 month.  4)  The medication list was reviewed and reconciled.  All changed / newly prescribed medications were explained.  A complete medication list was provided to the patient / caregiver.

## 2010-07-06 NOTE — Assessment & Plan Note (Signed)
Summary: 1 MONTH FU/JMS   History of Present Illness Visit Type: Follow-up Visit Primary GI MD: Sheryn Bison MD FACP FAGA Primary Provider: Seymour Bars, DO Requesting Provider: na Chief Complaint: Patient here to discuss results of GES. She states that she still experiences GERD symptoms along with bloating. History of Present Illness:   65 year old Caucasian female who continues with postprandial gas and bloating and vague abdominal discomfort refractory to provide therapy, omeprazole, and dietary restrictions. She has suspected gluten sensitivity but a negative small bowel biopsy for celiac disease.  Reason gastric emptying scan was entirely normal. I reviewed her records in detail and she had an extensive GI workup including colonoscopy, endoscopy, a lamp or H. pylori. All of these have been normal. Labs also are unremarkable. She has no anorexia, weight loss, or systemic complaints.   GI Review of Systems    Reports acid reflux, bloating, and  heartburn.      Denies abdominal pain, belching, chest pain, dysphagia with liquids, dysphagia with solids, loss of appetite, nausea, vomiting, vomiting blood, weight loss, and  weight gain.      Reports diverticulosis and  irritable bowel syndrome.     Denies anal fissure, black tarry stools, change in bowel habit, constipation, diarrhea, fecal incontinence, heme positive stool, hemorrhoids, jaundice, light color stool, liver problems, rectal bleeding, and  rectal pain.    Current Medications (verified): 1)  Crestor 10 Mg Tabs (Rosuvastatin Calcium) .... Take 1 Tablet By Mouth Once A Day 2)  Veramyst 27.5 Mcg/spray Susp (Fluticasone Furoate) .... 2 Sprays Per Nostril Daily As Needed 3)  Citalopram Hydrobromide 20 Mg Tabs (Citalopram Hydrobromide) .Marland Kitchen.. 1 Tab By Mouth Qhs 4)  Alprazolam 0.5 Mg Tabs (Alprazolam) .Marland Kitchen.. 1-2 Tabs By Mouth At Bedtime As Needed For Sleep/ Anxiety 5)  Spiriva Handihaler 18 Mcg  Caps (Tiotropium Bromide Monohydrate)  .... Two Puffs in Handihaler Daily 6)  Omeprazole 40 Mg Cpdr (Omeprazole) .... Take 1 Tablet By Mouth Once A Day 7)  Miralax  Powd (Polyethylene Glycol 3350) .... As Needed 8)  Vear Clock Colon Health  Caps (Probiotic Product) .... Take One By Mouth Two Times A Day, But Over The Counter 9)  Folic Acid 1 Mg Tabs (Folic Acid) .... Take One By Mouth Once Daily 10)  Fish Oil 1000 Mg Caps (Omega-3 Fatty Acids) .... Take 1 Capsule By Mouth Once Daily  Allergies (verified): 1)  ! Celebrex 2)  ! Vioxx 3)  ! Codeine 4)  ! Boniva (Ibandronate Sodium) 5)  ! Prednisone  Past History:  Past medical, surgical, family and social histories (including risk factors) reviewed for relevance to current acute and chronic problems.  Past Medical History: Reviewed history from 05/11/2010 and no changes required. Allergic rhinitis Depression Hx of Tobacco abuse LLL necrotizing granulomatous dz; 10-2008 moderate COPD on PFTs -- Dr Delford Field 09-2008; repeat 01-2010 DIVERTICULOSIS, COLON (ICD-562.10) ANAL PRURITUS (ICD-698.0) EPIGASTRIC PAIN (ICD-789.06) HIATAL HERNIA (ICD-553.3) GERD (ICD-530.81)    -Gastritis on EGD 11/11 OSTEOARTHRITIS (ICD-715.90) HYPERLIPIDEMIA (ICD-272.4) COLONIC POLYPS, ADENOMATOUS, HX OF (ICD-V12.72) Anxiety Disorder Arthritis  Past Surgical History: spinal fusion 1998 Hysterectomy  removal of lower left lung nodule Dr Edwyna Shell 10-2008 with post op lung collapse  Family History: Reviewed history from 04/07/2010 and no changes required. Family History Diabetes 1st degree relative: Mother Family History High cholesterol Family History Hypertension Breast ca - no Ovarian ca - no Diabetes Dementia - mother; died at 54 No FH of Colon Cancer:  Social History: Reviewed history from 04/07/2010 and no changes  required. 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  The patient denies anemia, anxiety-new, back pain, blood in urine, breast changes/lumps, change in vision, confusion, cough, coughing up blood, depression-new, fainting, fever, headaches-new, hearing problems, heart murmur, heart rhythm changes, itching, menstrual pain, night sweats, nosebleeds, pregnancy symptoms, shortness of breath, skin rash, sore throat, swelling of feet/legs, swollen lymph glands, thirst - excessive , urination - excessive , urination changes/pain, urine leakage, vision changes, and voice change.    Vital Signs:  Patient profile:   65 year old female Height:      62 inches Weight:      118.25 pounds BMI:     21.71 BSA:     1.53 Pulse rate:   84 / minute Pulse rhythm:   regular BP sitting:   112 / 70  (left arm) Cuff size:   regular  Vitals Entered By: Lamona Curl CMA Duncan Dull) (June 15, 2010 10:07 AM)  Physical Exam  General:  Well developed, well nourished, no acute distress.healthy appearing.   Head:  Normocephalic and atraumatic. Eyes:  PERRLA, no icterus. Abdomen:  Soft, nontender and nondistended. No masses, hepatosplenomegaly or hernias noted. Normal bowel sounds.no That distention, masses, or tenderness noted. Bowel sounds are normal. Psych:  Alert and cooperative. Normal mood and affect.   Impression & Recommendations:  Problem # 1:  IRRITABLE BOWEL SYNDROME (ICD-564.1) Assessment Unchanged Her gas and bloating is possibly related to chronic bacterial overgrowth syndrome. Workup otherwise has been unremarkable. I have decided to place her on Xifaxan 550 mg twice a day for 2 weeks and we'll change her to Align probiotic therapy with office followup in 3 weeks' time. If she is still having difficulties we will consider referral to [facility]. She is to  continue her gluten restriction since this does seem to help her symptomatology.  Problem # 2:  DIVERTICULOSIS, COLON (ICD-562.10) Assessment: Unchanged  Problem # 3:  ANAL PRURITUS (ICD-698.0) Assessment: Improved  Problem # 4:  COPD (ICD-496) Assessment: Unchanged continued followup with primary care as scheduled.  Patient Instructions: 1)  Copy sent to : Seymour Bars, DO 2)  Please schedule a follow-up appointment in 3 weeks.  3)  Your prescription(s) have been sent to you pharmacy.  4)  Low Fiber/Residue Diet handout given.  5)  The medication list was reviewed and reconciled.  All changed / newly prescribed medications were explained.  A complete medication list was provided to the patient / caregiver. 6)  Excessive Gas Diet handout given.  Prescriptions: XIFAXAN 550 MG TABS (RIFAXIMIN) take one by mouth two times a day for 14 days  #28 x 0   Entered by:   Harlow Mares CMA (AAMA)   Authorized by:   Mardella Layman MD [facility]   Signed by:   Harlow Mares CMA (AAMA) on 06/15/2010   Method used:   [facility]        [phone]ail[facility]       [address]       [place], [place]  [address]       Ph: [phone]       Fax: [phone]   RxID:  [id number]  
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
## 2010-07-07 NOTE — Medication Information (Signed)
Summary: Approved Nexium / [facility]  Approved Nexium / [employer]   Imported By: Lennie Odor 04/13/2010 11:10:09  _____________________________________________________________________  External Attachment:    Type:   Image     Comment:   External Document

## 2010-07-12 NOTE — Assessment & Plan Note (Signed)
Summary: 2 WK F/U APPT...LSW   Vital Signs:  Patient profile:   65 year old female Height:      62 inches Weight:      116 pounds BMI:     21.29 Pulse rate:   60 / minute Pulse rhythm:   regular BP sitting:   120 / 80  (left arm) Cuff size:   regular  Vitals Entered By: June McMurray CMA Duncan Dull) (July 06, 2010 9:35 AM)  History of Present Illness Visit Type: Follow-up Visit Primary GI MD: Sheryn Bison MD FACP FAGA Primary Provider: Seymour Bars, DO Requesting Provider: na Chief Complaint: patient has improved GERD symptoms History of Present Illness:   Emorie is remarkably improved since being treated for bacterial overgrowth syndrome with Xifaxan 550 mg twice a day for 10 days with probiotic therapy. Actually, she has no GI complaints currently, but is on a gluten restricted diet. There is no history of current GERD, dysphagia, melena or hematochezia. Her appetite is good and her weight is stable. She does use p.r.n. MiraLax for constipation. She has had urticaria in the past with sulfur medications and Cox 2 inhibitors.   GI Review of Systems    Reports acid reflux and  bloating.      Denies abdominal pain, belching, chest pain, dysphagia with liquids, dysphagia with solids, heartburn, loss of appetite, nausea, vomiting, vomiting blood, weight loss, and  weight gain.        Denies anal fissure, black tarry stools, change in bowel habit, constipation, diarrhea, diverticulosis, fecal incontinence, heme positive stool, hemorrhoids, irritable bowel syndrome, jaundice, light color stool, liver problems, rectal bleeding, and  rectal pain.  Current Medications (verified): 1)  Crestor 10 Mg Tabs (Rosuvastatin Calcium) .... Take 1 Tablet By Mouth Once A Day 2)  Veramyst 27.5 Mcg/spray Susp (Fluticasone Furoate) .... 2 Sprays Per Nostril Daily As Needed 3)  Bupropion Hcl 150 Mg Xr12h-Tab (Bupropion Hcl) .Marland Kitchen.. 1 Tab By Mouth Qam 4)  Alprazolam 0.5 Mg Tabs (Alprazolam) .Marland Kitchen.. 1-2 Tabs  By Mouth At Bedtime As Needed For Sleep/ Anxiety 5)  Spiriva Handihaler 18 Mcg  Caps (Tiotropium Bromide Monohydrate) .... Two Puffs in Handihaler Daily 6)  Omeprazole 40 Mg Cpdr (Omeprazole) .... Take 1 Tablet By Mouth Once A Day 7)  Miralax  Powd (Polyethylene Glycol 3350) .... As Needed 8)  Align  Caps (Probiotic Product) .... Take One By Mouth Once Daily 9)  Folic Acid 1 Mg Tabs (Folic Acid) .... Take One By Mouth Once Daily 10)  Fish Oil 1000 Mg Caps (Omega-3 Fatty Acids) .... Take 1 Capsule By Mouth Once Daily  Allergies (verified): 1)  ! Celebrex 2)  ! Vioxx 3)  ! Codeine 4)  ! Boniva (Ibandronate Sodium) 5)  ! Prednisone  Past History:  Past medical, surgical, family and social histories (including risk factors) reviewed for relevance to current acute and chronic problems.  Past Medical History: Reviewed history from 05/11/2010 and no changes required. Allergic rhinitis Depression Hx of Tobacco abuse LLL necrotizing granulomatous dz; 10-2008 moderate COPD on PFTs -- Dr Delford Field 09-2008; repeat 01-2010 DIVERTICULOSIS, COLON (ICD-562.10) ANAL PRURITUS (ICD-698.0) EPIGASTRIC PAIN (ICD-789.06) HIATAL HERNIA (ICD-553.3) GERD (ICD-530.81)    -Gastritis on EGD 11/11 OSTEOARTHRITIS (ICD-715.90) HYPERLIPIDEMIA (ICD-272.4) COLONIC POLYPS, ADENOMATOUS, HX OF (ICD-V12.72) Anxiety Disorder Arthritis  Past Surgical History: Reviewed history from 06/27/2010 and no changes required. spinal fusion 1998 Hysterectomy  removal of lower left lung nodule Dr Edwyna Shell 10-2008 with post op lung collapse EGD 04-2010 Dr Jarold Motto:  mod gastritis Gastric emptying study 06-2010 normal  Family History: Reviewed history from 06/15/2010 and no changes required. Family History Diabetes 1st degree relative: Mother Family History High cholesterol Family History Hypertension Breast ca - no Ovarian ca - no Diabetes Dementia - mother; died at 105 No FH of Colon Cancer:  Social History: Reviewed  history from 06/15/2010 and no changes required. 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 14 yrs) 2 children and 1 step child  7 grandchildren Quit smoking 10-02-2008 Alcohol use-yes-rarely Daily Caffeine Use: 2 daily   Review of Systems       The patient complains of arthritis/joint pain, muscle pains/cramps, shortness of breath, and sleeping problems.  The patient denies allergy/sinus, anemia, anxiety-new, back pain, blood in urine, breast changes/lumps, change in vision, confusion, cough, coughing up blood, depression-new, fainting, fatigue, fever, headaches-new, hearing problems, heart murmur, heart rhythm changes, itching, menstrual pain, night sweats, nosebleeds, pregnancy symptoms, skin rash, sore throat, swelling of feet/legs, swollen lymph glands, thirst - excessive , urination - excessive , urination changes/pain, urine leakage, vision changes, and voice change.    Physical Exam  General:  Well developed, well nourished, no acute distress.healthy appearing.   Head:  Normocephalic and atraumatic. Eyes:  PERRLA, no icterus.exam deferred to patient's ophthalmologist.   Psych:  Alert and cooperative. Normal mood and affect.   Impression & Recommendations:  Problem # 1:  IRRITABLE BOWEL SYNDROME (ICD-564.1) Assessment Improved I have suggested probiotics 5-7 days out of each month. She may need periodic retreatment with Xifaxan. Omeprazole has been discontinued. She is to call should she have a severe exacerbation of her problems. She does not have celiac disease but appears to have gluten sensitivity, and we have decided to continue gluten elimination from her diet. I will see her again in 3 months time or p.r.n. as needed.  Problem # 2:  ANAL PRURITUS (ICD-698.0) Assessment: Improved  Problem # 3:  DIVERTICULOSIS, COLON (ICD-562.10) Assessment: Comment Only  Problem # 4:  EPIGASTRIC PAIN (ICD-789.06) Assessment: Improved  Problem # 5:  GERD  (ICD-530.81) Assessment: Improved Discontinue omeprazole daily and use p.r.n. Reflux regime and maneuvers also reviewed.  Patient Instructions: 1)  Copy sent to : Seymour Bars, DO 2)  Stop Omeprazole and use p.r.n. as needed for acid reflux symptoms 3)  Take Align only 5 days per month. 4)  Please schedule a follow-up appointment in 3 months. 5)  The medication list was reviewed and reconciled.  All changed / newly prescribed medications were explained.  A complete medication list was provided to the patient / caregiver. 6)  Please continue current medications.

## 2010-08-02 ENCOUNTER — Encounter: Payer: Self-pay | Admitting: Family Medicine

## 2010-08-02 ENCOUNTER — Ambulatory Visit (INDEPENDENT_AMBULATORY_CARE_PROVIDER_SITE_OTHER): Payer: 59 | Admitting: Family Medicine

## 2010-08-02 DIAGNOSIS — J0100 Acute maxillary sinusitis, unspecified: Secondary | ICD-10-CM | POA: Insufficient documentation

## 2010-08-02 DIAGNOSIS — H8110 Benign paroxysmal vertigo, unspecified ear: Secondary | ICD-10-CM

## 2010-08-07 ENCOUNTER — Ambulatory Visit: Payer: 59 | Admitting: Family Medicine

## 2010-08-10 NOTE — Assessment & Plan Note (Signed)
Summary: dizziness   Vital Signs:  Patient profile:   65 year old female Height:      62 inches Weight:      114 pounds BMI:     20.93 O2 Sat:      96 % on Room air Pulse rate:   77 / minute BP sitting:   111 / 77  (left arm) Cuff size:   regular  Vitals Entered By: Payton Spark CMA (August 02, 2010 2:25 PM)  O2 Flow:  Room air CC: Very dizzy. Also c/o L ear pain   Primary Care Provider:  Seymour Bars, DO  CC:  Very dizzy. Also c/o L ear pain.  History of Present Illness: 65 yo WF presents for dizziness that has come and gone for years but now is worse with L ear being stopped up.  Using Veramyst.  Had  vomitting and the room spinning yesterday.  Worse with looking up or down.  Saw Dr Maryellen Pile for this 2 yrs ago and had a sinus Xray.  Has not done any tx for this.  Has underlying allergies, formerly well controlled on vermayst but congestion has increased esp over the L cheek and nostril.  Using Netti pot but not consistently.  Denies F/C but has some HA.  Denies tinnitus or hearing loss.  Rotational dizziness brought on but flex/ ext of the neck.     Current Medications (verified): 1)  Crestor 10 Mg Tabs (Rosuvastatin Calcium) .... Take 1 Tablet By Mouth Once A Day 2)  Veramyst 27.5 Mcg/spray Susp (Fluticasone Furoate) .... 2 Sprays Per Nostril Daily As Needed 3)  Bupropion Hcl 150 Mg Xr12h-Tab (Bupropion Hcl) .Marland Kitchen.. 1 Tab By Mouth Qam 4)  Spiriva Handihaler 18 Mcg  Caps (Tiotropium Bromide Monohydrate) .... Two Puffs in Handihaler Daily 5)  Miralax  Powd (Polyethylene Glycol 3350) .... As Needed 6)  Align  Caps (Probiotic Product) .... Take One By Mouth 5 Days of A Month 7)  Folic Acid 1 Mg Tabs (Folic Acid) .... Take One By Mouth Once Daily 8)  Fish Oil 1000 Mg Caps (Omega-3 Fatty Acids) .... Take 1 Capsule By Mouth Once Daily  Allergies (verified): 1)  ! Celebrex 2)  ! Vioxx 3)  ! Codeine 4)  ! Boniva (Ibandronate Sodium) 5)  ! Prednisone  Past History:  Past  Surgical History: Reviewed history from 06/27/2010 and no changes required. spinal fusion 1998 Hysterectomy  removal of lower left lung nodule Dr Edwyna Shell 10-2008 with post op lung collapse EGD 04-2010 Dr Jarold Motto: mod gastritis Gastric emptying study 06-2010 normal  Review of Systems      See HPI  Physical Exam  General:  alert, well-developed, well-nourished, and well-hydrated.   Head:  normocephalic and atraumatic.  L maxillary sinusitis TTP Eyes:  conjunctiva clear, slightly watery Ears:  EACs patent; TMs translucent and gray with good cone of light and bony landmarks.  Nose:  clear rhinorrhea, nasal congestion Mouth:  o/p pink and moist with clear postnasal drip Neck:  supple.   Lungs:  Normal respiratory effort, chest expands symmetrically. Lungs are clear to auscultation, no crackles or wheezes. Heart:  Normal rate and regular rhythm. S1 and S2 normal without gallop, murmur, click, rub or other extra sounds. Neurologic:  no horizontal or vertical nystagmus   Impression & Recommendations:  Problem # 1:  BENIGN PAROXYSMAL POSITIONAL VERTIGO (ICD-386.11) Assessment Deteriorated exam and hx c/w BPPV, worsened by increased head congestion and sinusitis. Will treat current symptoms with lorazepam.  Avoid  quick head movements.  Given chronicity of vertigo, I did recommend vesibular rehab if not resolved in 1 wk.    Problem # 2:  ACUTE MAXILLARY SINUSITIS (ICD-461.0) Assessment: New Underlying allergies with increased L sided nasal/ head congestion and L ear fullness/ dizziness.  Will treat for ABS with 5 days of Azithromycin.  OK to continue Veramyst and use [employer] two times a day.  Add Zyrtec for allergies for the next 10 days.   Her updated medication list for this problem includes:    Veramyst 27.5 Mcg/spray Susp (Fluticasone furoate) .Marland Kitchen... 2 sprays per nostril daily as needed    Azithromycin 250 Mg Tabs (Azithromycin) .Marland Kitchen... 2 tabs by mouth x 1 day then 1 tab by mouth daily x  4 days  Complete Medication List: 1)  Crestor 10 Mg Tabs (Rosuvastatin calcium) .... Take 1 tablet by mouth once a day 2)  Veramyst 27.5 Mcg/spray Susp (Fluticasone furoate) .... 2 sprays per nostril daily as needed 3)  Bupropion Hcl 150 Mg Xr12h-tab (Bupropion hcl) .Marland Kitchen.. 1 tab by mouth qam 4)  Spiriva Handihaler 18 Mcg Caps (Tiotropium bromide monohydrate) .... Two puffs in handihaler daily 5)  Miralax Powd (Polyethylene glycol 3350) .... As needed 6)  Align Caps (Probiotic product) .... Take one by mouth 5 days of a month 7)  Folic Acid 1 Mg Tabs (Folic acid) .... Take one by mouth once daily 8)  Fish Oil 1000 Mg Caps (Omega-3 fatty acids) .... Take 1 capsule by mouth once daily 9)  Lorazepam 1 Mg Tabs (Lorazepam) .... 1/2 to 1 tab by mouth q 8 hrs as needed dizziness 10)  Azithromycin 250 Mg Tabs (Azithromycin) .... 2 tabs by mouth x 1 day then 1 tab by mouth daily x 4 days  Patient Instructions: 1)  For vertigo: 2)  Take Lorazepam 1/2 to 1 tab up to 3 x a day for the next wk for dizziness as needed. 3)  TAke Azithromycin (antibiotic) to cover for maxillary sinusitis. 4)  Stay on Veramyst and use Netti Pot 2 x a day. 5)  Add Zyrtec OTC in the evenings for the next wk. 6)  Return for f/u in 1 wk if not improving. Prescriptions: AZITHROMYCIN 250 MG TABS (AZITHROMYCIN) 2 tabs by mouth x 1 day then 1 tab by mouth daily x 4 days  #6 tabs x 0   Entered and Authorized by:   Seymour Bars DO   Signed by:   Seymour Bars DO on 08/02/2010   Method used:   Electronically to        [facility] [phone]* (retail)       [address]       [place], [place]  [address]       Ph: [phone]       Fax: [phone]   RxID:   [id number] LORAZEPAM 1 MG TABS (LORAZEPAM) 1/2 to 1 tab by mouth q 8 hrs as needed dizziness  #20 x 0   Entered and Authorized by:   Seymour Bars DO   Signed by:   Seymour Bars DO on 08/02/2010   Method used:   Printed then faxed to ...       [facility] [phone]* (retail)       [address]       [place], [place]  [address]       Ph: [phone]  Fax: [phone]   RxID:   [id number]    Orders Added: 1)  Est. Patient Level IV [02725]

## 2010-08-16 ENCOUNTER — Telehealth: Payer: Self-pay | Admitting: Family Medicine

## 2010-08-22 NOTE — Progress Notes (Signed)
Summary: Referral  Phone Note Call from Patient   Caller: Patient Summary of Call: Pt called stating she is still dizzy and would like for you to refer her where she needs to go to have "ear crystals" looked at. Please advise.  Initial call taken by: Payton Spark CMA,  August 16, 2010 3:37 PM  Follow-up for Phone Call        ent referral made, can go wherever she prefers- [place], [place] or [place] Follow-up by: Seymour Bars DO,  August 16, 2010 4:00 PM

## 2010-08-25 ENCOUNTER — Other Ambulatory Visit: Payer: Self-pay | Admitting: Family Medicine

## 2010-08-27 ENCOUNTER — Other Ambulatory Visit: Payer: Self-pay | Admitting: Critical Care Medicine

## 2010-08-27 DIAGNOSIS — J449 Chronic obstructive pulmonary disease, unspecified: Secondary | ICD-10-CM

## 2010-08-29 ENCOUNTER — Other Ambulatory Visit: Payer: Self-pay | Admitting: Critical Care Medicine

## 2010-09-11 LAB — COMPREHENSIVE METABOLIC PANEL
Albumin: 1.6 g/dL — ABNORMAL LOW (ref 3.5–5.2)
BUN: 2 mg/dL — ABNORMAL LOW (ref 6–23)
Calcium: 8.3 mg/dL — ABNORMAL LOW (ref 8.4–10.5)
Creatinine, Ser: 0.53 mg/dL (ref 0.4–1.2)
Total Protein: 5 g/dL — ABNORMAL LOW (ref 6.0–8.3)

## 2010-09-11 LAB — GLUCOSE, CAPILLARY
Glucose-Capillary: 100 mg/dL — ABNORMAL HIGH (ref 70–99)
Glucose-Capillary: 108 mg/dL — ABNORMAL HIGH (ref 70–99)

## 2010-09-11 LAB — DIFFERENTIAL
Basophils Absolute: 0.2 10*3/uL — ABNORMAL HIGH (ref 0.0–0.1)
Basophils Relative: 0 % (ref 0–1)
Eosinophils Absolute: 0 10*3/uL (ref 0.0–0.7)
Eosinophils Absolute: 0.4 10*3/uL (ref 0.0–0.7)
Eosinophils Relative: 0 % (ref 0–5)
Lymphocytes Relative: 4 % — ABNORMAL LOW (ref 12–46)
Lymphs Abs: 0.3 10*3/uL — ABNORMAL LOW (ref 0.7–4.0)
Lymphs Abs: 0.8 10*3/uL (ref 0.7–4.0)
Neutro Abs: 16.7 10*3/uL — ABNORMAL HIGH (ref 1.7–7.7)

## 2010-09-11 LAB — BASIC METABOLIC PANEL
BUN: 11 mg/dL (ref 6–23)
BUN: 5 mg/dL — ABNORMAL LOW (ref 6–23)
BUN: 7 mg/dL (ref 6–23)
CO2: 28 mEq/L (ref 19–32)
CO2: 28 mEq/L (ref 19–32)
CO2: 30 mEq/L (ref 19–32)
Chloride: 101 mEq/L (ref 96–112)
Chloride: 104 mEq/L (ref 96–112)
Creatinine, Ser: 0.64 mg/dL (ref 0.4–1.2)
Creatinine, Ser: 0.7 mg/dL (ref 0.4–1.2)
GFR calc Af Amer: >60 mL/min (ref >60)
GFR calc non Af Amer: >60 mL/min (ref >60)
GFR calc non Af Amer: >60 mL/min (ref >60)
Glucose, Bld: 102 mg/dL — ABNORMAL HIGH (ref 70–99)
Glucose, Bld: 143 mg/dL — ABNORMAL HIGH (ref 70–99)
Glucose, Bld: 158 mg/dL — ABNORMAL HIGH (ref 70–99)
Potassium: 3.4 mEq/L — ABNORMAL LOW (ref 3.5–5.1)
Potassium: 3.7 mEq/L (ref 3.5–5.1)
Potassium: 4 mEq/L (ref 3.5–5.1)
Potassium: 4 mEq/L (ref 3.5–5.1)
Sodium: 139 mEq/L (ref 135–145)

## 2010-09-11 LAB — CBC
HCT: 33.7 % — ABNORMAL LOW (ref 36.0–46.0)
HCT: 34.2 % — ABNORMAL LOW (ref 36.0–46.0)
HCT: 37.3 % (ref 36.0–46.0)
HCT: 42.2 % (ref 36.0–46.0)
MCHC: 34.3 g/dL (ref 30.0–36.0)
MCV: 92.4 fL (ref 78.0–100.0)
MCV: 92.5 fL (ref 78.0–100.0)
MCV: 93.2 fL (ref 78.0–100.0)
MCV: 93.8 fL (ref 78.0–100.0)
Platelets: 402 10*3/uL — ABNORMAL HIGH (ref 150–400)
Platelets: 408 10*3/uL — ABNORMAL HIGH (ref 150–400)
Platelets: 433 10*3/uL — ABNORMAL HIGH (ref 150–400)
RBC: 3.69 MIL/uL — ABNORMAL LOW (ref 3.87–5.11)
RDW: 12.2 % (ref 11.5–15.5)
RDW: 13 % (ref 11.5–15.5)
WBC: 15.2 10*3/uL — ABNORMAL HIGH (ref 4.0–10.5)
WBC: 9.3 10*3/uL (ref 4.0–10.5)

## 2010-09-11 LAB — URINE CULTURE

## 2010-09-11 LAB — BODY FLUID CULTURE

## 2010-09-11 LAB — POCT CARDIAC MARKERS
CKMB, poc: 1.9 ng/mL (ref 1.0–8.0)
Troponin i, poc: <0.05 ng/mL (ref 0.00–0.09)

## 2010-09-11 LAB — PROTIME-INR: INR: 1 (ref 0.00–1.49)

## 2010-09-12 LAB — CBC
HCT: 30 % — ABNORMAL LOW (ref 36.0–46.0)
Hemoglobin: 10.4 g/dL — ABNORMAL LOW (ref 12.0–15.0)
Hemoglobin: 11.3 g/dL — ABNORMAL LOW (ref 12.0–15.0)
MCHC: 34.4 g/dL (ref 30.0–36.0)
MCHC: 34.5 g/dL (ref 30.0–36.0)
MCV: 92.9 fL (ref 78.0–100.0)
MCV: 93.3 fL (ref 78.0–100.0)
MCV: 93.7 fL (ref 78.0–100.0)
Platelets: 150 10*3/uL (ref 150–400)
RBC: 3.43 MIL/uL — ABNORMAL LOW (ref 3.87–5.11)
RBC: 4.51 MIL/uL (ref 3.87–5.11)
RDW: 12.9 % (ref 11.5–15.5)
RDW: 13 % (ref 11.5–15.5)
WBC: 5.5 10*3/uL (ref 4.0–10.5)
WBC: 5.6 10*3/uL (ref 4.0–10.5)

## 2010-09-12 LAB — GLUCOSE, CAPILLARY
Glucose-Capillary: 106 mg/dL — ABNORMAL HIGH (ref 70–99)
Glucose-Capillary: 114 mg/dL — ABNORMAL HIGH (ref 70–99)
Glucose-Capillary: 117 mg/dL — ABNORMAL HIGH (ref 70–99)
Glucose-Capillary: 120 mg/dL — ABNORMAL HIGH (ref 70–99)
Glucose-Capillary: 127 mg/dL — ABNORMAL HIGH (ref 70–99)
Glucose-Capillary: 127 mg/dL — ABNORMAL HIGH (ref 70–99)
Glucose-Capillary: 169 mg/dL — ABNORMAL HIGH (ref 70–99)
Glucose-Capillary: 78 mg/dL (ref 70–99)
Glucose-Capillary: 91 mg/dL (ref 70–99)

## 2010-09-12 LAB — COMPREHENSIVE METABOLIC PANEL
ALT: 14 U/L (ref 0–35)
AST: 19 U/L (ref 0–37)
AST: 30 U/L (ref 0–37)
Alkaline Phosphatase: 66 U/L (ref 39–117)
CO2: 29 mEq/L (ref 19–32)
CO2: 29 mEq/L (ref 19–32)
Calcium: 8.2 mg/dL — ABNORMAL LOW (ref 8.4–10.5)
Chloride: 107 mEq/L (ref 96–112)
Creatinine, Ser: 0.53 mg/dL (ref 0.4–1.2)
Creatinine, Ser: 0.66 mg/dL (ref 0.4–1.2)
GFR calc Af Amer: >60 mL/min (ref >60)
GFR calc Af Amer: >60 mL/min (ref >60)
GFR calc non Af Amer: >60 mL/min (ref >60)
GFR calc non Af Amer: >60 mL/min (ref >60)
Sodium: 135 mEq/L (ref 135–145)
Sodium: 141 mEq/L (ref 135–145)
Total Bilirubin: 0.7 mg/dL (ref 0.3–1.2)
Total Protein: 5.3 g/dL — ABNORMAL LOW (ref 6.0–8.3)

## 2010-09-12 LAB — BASIC METABOLIC PANEL
CO2: 27 mEq/L (ref 19–32)
CO2: 33 mEq/L — ABNORMAL HIGH (ref 19–32)
Calcium: 7.9 mg/dL — ABNORMAL LOW (ref 8.4–10.5)
Chloride: 102 mEq/L (ref 96–112)
Chloride: 103 mEq/L (ref 96–112)
Chloride: 104 mEq/L (ref 96–112)
GFR calc non Af Amer: >60 mL/min (ref >60)
GFR calc non Af Amer: >60 mL/min (ref >60)
Glucose, Bld: 102 mg/dL — ABNORMAL HIGH (ref 70–99)
Glucose, Bld: 176 mg/dL — ABNORMAL HIGH (ref 70–99)
Potassium: 3.3 mEq/L — ABNORMAL LOW (ref 3.5–5.1)
Potassium: 4 mEq/L (ref 3.5–5.1)
Sodium: 133 mEq/L — ABNORMAL LOW (ref 135–145)
Sodium: 140 mEq/L (ref 135–145)
Sodium: 140 mEq/L (ref 135–145)

## 2010-09-12 LAB — URINALYSIS, ROUTINE W REFLEX MICROSCOPIC
Bilirubin Urine: NEGATIVE
Glucose, UA: NEGATIVE mg/dL
Hgb urine dipstick: NEGATIVE
Ketones, ur: NEGATIVE mg/dL
pH: 5.5 (ref 5.0–8.0)

## 2010-09-12 LAB — TYPE AND SCREEN: Antibody Screen: NEGATIVE

## 2010-09-12 LAB — AFB CULTURE WITH SMEAR (NOT AT ARMC)
Acid Fast Smear: NONE SEEN
Special Requests: 2732

## 2010-09-12 LAB — BLOOD GAS, ARTERIAL
TCO2: 23.3 mmol/L (ref 0–100)
pCO2 arterial: 33.2 mmHg — ABNORMAL LOW (ref 35.0–45.0)
pH, Arterial: 7.441 — ABNORMAL HIGH (ref 7.350–7.400)

## 2010-09-12 LAB — FUNGUS CULTURE W SMEAR: Fungal Smear: NONE SEEN

## 2010-09-12 LAB — TISSUE CULTURE: Special Requests: 2732

## 2010-10-17 NOTE — Assessment & Plan Note (Signed)
OFFICE VISIT   DELIANA, AVALOS  DOB:  1945/11/17                                        January 26, 2009  CHART #:  [id number]   The patient returns 6 weeks after her surgery.  Her chest x-ray showed  decreased left pleural effusion and postoperative changes.  She is still  having some moderate amount of chest pain, but this is gradually  improving.  Her blood pressure is 132/70, pulse 71, respirations 18,  sats were 95%.  Her weight is 100, which is stable.  She was having some  problems with divorce and emotional lability, but this has improved.  I  will see her back again in 2 months with another chest x-ray.   Ines Bloomer, M.D.  Electronically Signed   DPB/MEDQ  D:  01/26/2009  T:  01/27/2009  Job:  [id number]

## 2010-10-17 NOTE — H&P (Signed)
NAMELERONDA, Linda Rangel              ACCOUNT NO.:  [id number]   MEDICAL RECORD NO.:  [id number]          PATIENT TYPE:  INP   LOCATION:  3301                         FACILITY:  [facility]   PHYSICIAN:  Ines Bloomer, M.D. DATE OF BIRTH:  12-10-1945   DATE OF ADMISSION:  11/08/2008  DATE OF DISCHARGE:                              HISTORY & PHYSICAL   CHIEF COMPLAINT:  Shortness of breath, status post left thoracotomy and  left lower lobe superior segmentectomy.   HISTORY OF PRESENT ILLNESS:  Ms. Waldvogel is a 65 year old female who  underwent left thoracotomy with left lower lobe superior segmentectomy  by Dr. Edwyna Shell on Oct 28, 2008.  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  The patient's postoperative course  was pretty much unremarkable.  She did well and was discharged to home  in stable condition postop day #6, November 03, 2008.  The patient was  initially doing well at home.  Then on Friday, she noted mild shortness  of breath.  On Saturday, this increased with increasing pain.  She spoke  with Dr. Donata Clay on Saturday and more pain pills were called in.  This  did not seem to help.  Her symptoms continued to increase in severity.  This morning, she had a significant shortness of breath with increasing  pain left side.  She went to the [facility] ER in [facility].  There a  chest x-ray was done, which showed a large a partially loculated left  hydropneumothorax with minimal left-to-right mediastinal shift.  The  patient was then transferred to [facility] ER in [place].   Currently, the patient is feeling better on 6 L oxygen.  She does  complain of difficulty voiding with dysuria.  She also complains of some  mild abdominal discomfort.  Her last bowel movement was on Saturday.  She does still have flatus, but does have some mild nausea and no  vomiting.  She denies fevers, chills, night sweats, coughing,  hemoptysis, or wheezing.   For the patient's past  medical history, surgical history, social  history, and family history, please see dictated H and P done on Oct 26, 2008.   REVIEW OF SYSTEMS:  See HPI for pertinent positives and negatives.  The  patient denies any recent headaches, changes in vision or hearing,  difficulty swallowing.  Denies any chest pain, palpitations,  lightheadedness, dizziness.  She denies any diarrhea or constipation.  She denies any muscle weakness, TIA, or CVA symptoms such as amaurosis  fugax or slurred speech.   PHYSICAL EXAMINATION:  VITAL SIGNS:  Currently not available.  GENERAL:  The patient is a well-nourished white female in no acute  distress.  RESPIRATORY:  Diminished to absent breath sounds, left lung.  CARDIAC:  She is sinus tachycardiac.  ABDOMEN:  Positive bowel sounds.  Soft.  Mild tenderness, generalized.  EXTREMITIES:  Warm and well perfused.  She has 2+ bilateral radial DP  and PT pulses.  SKIN:  Incisions are clean, dry, and intact and healing well.  Sutures  noted to be in  place at her chest tube sites.   STUDIES:  The patient had a portable chest x-ray done today showing a  large, partially loculated left hydropneumothorax with minimal left to  right mediastinal shift indicating mild tension.   LABORATORY DATA:  Laboratory work showed cardiac markers CK-MB of 1.9,  troponin I is less than 0.05, myoglobin of 84.7.  INR of 1.0.  PT of  13.9.  Sodium of 133, potassium 4.0, chloride of 94, bicarbonate 26, BUN  of 11, creatinine 0.7, glucose of 158.  White blood cell count of 19.5,  hemoglobin of 14.6, hematocrit of 42.2, platelet count of 408.  PTT of  31.   IMPRESSION AND PLAN:  The patient is status post left thoracotomy with  left lower lobe superior segmentectomy.  She presents with a  postoperative large left partially loculated hydropneumothorax.  Plan to  admit the patient to the surgical intensive care unit at [facility].  We will obtain a CT scan of the chest with  contrast for  further evaluation.  We will start the patient on IV antibiotics,  vancomycin, and Maxipime.  Check urinalysis and culture and routine  blood work in the morning.  We will restart the patient's home  medications.  Currently, we will keep the patient n.p.o. until evaluated  further by Dr. Edwyna Shell.      Sol Blazing, PA      Ines Bloomer, M.D.  Electronically Signed    KMD/MEDQ  D:  11/08/2008  T:  11/08/2008  Job:  [id number]

## 2010-10-17 NOTE — Assessment & Plan Note (Signed)
OFFICE VISIT   Linda Rangel, Linda Rangel  DOB:  [birth year]                                        April 05, 2009  CHART #:  [id number]   The patient came for follow up today.  She is having minimal pain.  Her  incision is well healed.  Her blood pressure is 128/80, pulse 75,  respirations 18, sats were 100%.  She is doing well overall, and I have  plan to release her back to her medical doctor.  I will see her again if  she has any future problems.   Ines Bloomer, M.D.  Electronically Signed   DPB/MEDQ  D:  04/05/2009  T:  04/06/2009  Job:  [id number]

## 2010-10-17 NOTE — Assessment & Plan Note (Signed)
OFFICE VISIT   Linda Rangel, Linda Rangel  DOB:  04-Apr-1946                                        November 23, 2008  CHART #:  [id number]   The patient came today back for followup.  Her chest x-ray showed  resolution of the multiloculated left pleural hemithorax and some  decreased fluid.  She is doing much better overall.  We removed her  chest tube sutures.  Her blood pressure was 107/78, pulse 100,  respirations 20, and saturations were 95%.  I will plan to see her back  again in 4 weeks with a chest x-ray because of her sinus problems.  I  have referred her to Dr. Lazarus Salines for evaluation.   Ines Bloomer, M.D.  Electronically Signed   DPB/MEDQ  D:  11/23/2008  T:  11/24/2008  Job:  [id number]

## 2010-10-17 NOTE — Discharge Summary (Signed)
NAMELONNETTE, SHRODE              ACCOUNT NO.:  [id number]   MEDICAL RECORD NO.:  [id number]          PATIENT TYPE:  INP   LOCATION:  2001                         FACILITY:  [facility]   PHYSICIAN:  Ines Bloomer, M.D. DATE OF BIRTH:  29-Aug-1945   DATE OF ADMISSION:  10/28/2008  DATE OF DISCHARGE:  11/03/2008                               DISCHARGE SUMMARY   ADMITTING DIAGNOSES:  1. Left lower lobe lesion.  2. History of hyperlipidemia.  3. History of chronic obstructive pulmonary disease.  4. History of tobacco abuse.  5. History of rhinitis.   DISCHARGE DIAGNOSES:  1. Left lower lobe lesion.  2. History of hyperlipidemia.  3. History of chronic obstructive pulmonary disease.  4. History of tobacco abuse.  5. History of rhinitis.   PROCEDURE:  Left video-assisted thoracoscopic surgery, left mini  thoracotomy, left lower lobe superior segmentectomy by Dr. Edwyna Shell on Oct 28, 2008 and lymph node dissection.  Pathology results left lower lobe  superior segment was positive for necrotizing (caseous granulomatous  inflammation, no malignancy identified, lymph nodes were benign).   HISTORY OF PRESENT ILLNESS:  This is a 65 year old female with a past  medical history of COPD and tobacco abuse (quit approximately 6 weeks  ago who on chest x-ray was found to have a left lower lobe lung nodule.  The patient also had a CAT scan of the chest on September 23, 2008 which  showed a spiculated 1.7 cm mass in the superior segment of the left  lower lobe highly suspicious for bronchogenic carcinoma.  No evidence of  metastatic disease seen.  Moderate emphysema.  The patient then had a  PET scan done on September 29, 2008 which showed a 12-mm spiculated lesion  in the left lower lobe that was mildly hypermetabolic (an SUV max of  3.6).  Her pulmonary function test showed an FVC of 3.23, an FEV-1 of  1.54 with a diffusion capacity of 41%.  The patient reported no  hemoptysis, fever, chills, or excessive  sputum.  She was admitted to  [facility] on Oct 26, 2008 to undergo the aforementioned left lung  surgery by Dr. Edwyna Shell.   BRIEF HOSPITAL COURSE STAY:  The patient was afebrile and  hemodynamically stable.  Postoperatively, she was found to have a 1/7  air leak.  Suction was removed on postop day #2.  Posterior chest tube  was removed on Oct 31, 2008.  Followup chest x-ray revealed an  approximate 5% left apical pneumothorax.  Remaining chest tube was  removed on Nov 01, 2008.  This followup chest x-ray revealed a trace  left apical pneumothorax (small than previously seen).  PCA was removed.  The patient was weaned off oxygen and continued to improve such that by  currently on postoperative day #5, she is afebrile.  Vital signs are  stable.  O2 sat is 94% on room air.  Chest x-ray today showed a stable  small left apical pneumothorax.   PHYSICAL EXAMINATION:  CARDIOVASCULAR:  Regular rate and rhythm.  PULMONARY:  Clear to auscultation bilaterally.  Left chest incisions  clean, dry  and continuing to heal.   Provided the patient remains afebrile, hemodynamically stable and the  chest x-ray remains stable she will be discharged on November 03, 2008.   LATEST LABORATORY STUDIES:  Last BMET done Nov 01, 2008 showed the  potassium to be 4, BUN and creatinine 2 and 0.53 respectively.  A CBC  done on Oct 31, 2008 H&H 10.4 and 30 respectively, white count 5600, and  platelet count at 155,000.  Tissue cultures showed no white blood cells,  no growth and no organisms seen.  AFB no acid-fast bacillus seen.  Fungus culture showed no yeast or fungal element seen.   DISCHARGE INSTRUCTIONS:  The patient is to remain on a low-fat diet.  She may shower.  She is not to lift or drive for 2 weeks. She is to  continue with her breathing exercise daily.  She is to walk everyday and  increase her frequency and duration as tolerates.  She is to use soap  and water on her left chest wound.  She may then let her  wounds open to  air.  She has a followup appointment to see Dr. Edwyna Shell on November 09, 2008  at 4 p.m. and prior to this office appointment a chest x-ray will be  obtained.  Chest tube sutures will be removed at the office appointment  with Dr. Edwyna Shell.   DISCHARGE MEDICATIONS:  1. Alprazolam 0.25 mg p.o. three times daily.  2. Spiriva 2 puffs in the a.m.  3. Nicotine patch 21 mg per 24-hour patch, remove old patch before      applying new one.  4. Percocet 5/325 1-2 tablets every 4-6 hours as needed for pain.  5. Xyzal 5 mg p.o. daily p.r.n.      Doree Fudge, [place]      Ines Bloomer, M.D.  Electronically Signed    DZ/MEDQ  D:  11/02/2008  T:  11/03/2008  Job:  [id number]   cc:   Charlcie Cradle. Delford Field, MD, FCCP  Barbette Hair. Artist Pais, DO

## 2010-10-17 NOTE — Discharge Summary (Signed)
Linda Rangel, Linda Rangel              ACCOUNT NO.:  [id number]   MEDICAL RECORD NO.:  [id number]          PATIENT TYPE:  INP   LOCATION:  2027                         FACILITY:  [facility]   PHYSICIAN:  Ines Bloomer, M.D. DATE OF BIRTH:  03/09/1946   DATE OF ADMISSION:  11/08/2008  DATE OF DISCHARGE:                               DISCHARGE SUMMARY   HISTORY:  The patient is a 65 year old female who underwent a left  thoracotomy with left lower lobe superior segmentectomy by Dr. Edwyna Shell on  Oct 28, 2008.  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  The patient's postoperative course was pretty much  unremarkable.  She did well and was discharged in stable condition on  postoperative day #6, November 03, 2008.  The patient was initially doing  well at home.  Then on Friday prior to admission, she noted some mild  shortness of breath.  On Saturday, this increased as well as did her  pain.  She spoke with Dr. Donata Clay on Saturday and was given more pain  medication.  This did not seem to help.  Her symptoms continued to  increase in severity.  She developed increasing shortness of breath and  presented to the [facility] Emergency Room at [facility].  A chest x-ray  done at that time showed a large, partially loculated left  hydropneumothorax with minimal left to right mediastinal shift.  The  patient was then transferred to the [facility] Emergency Room.  She was placed  on oxygen.  She did complain of some difficulty with voiding as well as  dysuria.  She also complained of some mild abdominal discomfort.  She  was felt to require admission for further evaluation and treatment.   PAST MEDICAL HISTORY, SURGICAL HISTORY, SOCIAL HISTORY, AND FAMILY  HISTORY:  Please see the dictated history and physical done on Oct 26, 2008.   REVIEW OF SYSTEMS:  Please see the history and physical.   PHYSICAL EXAMINATION:  Please see the history and physical.   HOSPITAL COURSE:  The  patient was admitted.  Cardiac markers showed CK-  MB of 1.9 and troponin I of less than 0.05 with a myoglobin of 84.7.  She was admitted to the hospital.  The patient had a chest tube placed  on November 08, 2008.  This was done by Dr. Edwyna Shell using intravenous  sedation.  She tolerated this procedure well.  The chest tube was  managed over the next several days.  It was discontinued on November 12, 2008.  Her chest x-ray continuous to show a gradual improvement.  There  is no evidence of pneumothorax.  Tentatively, she is scheduled for  discharge on November 13, 2008.   CONDITION ON DISCHARGE:  Stable and improving.   DISCHARGE MEDICATIONS:  As preoperatively.  Additionally, she has been  placed on doxycycline 100 mg twice a day.  We will continue this for 1  week.  1. Crestor 10 mg daily.  2. Xyzal 5 mg daily.  3. Nasonex 50 mcg daily.  4. Xanax 0.25 mg p.r.n.  5. Spiriva inhaler daily.  6. Percocet p.r.n.  7. Tramadol p.r.n.  8. Nicotine patch p.r.n.   FOLLOWUP APPOINTMENT:  We will made for the patient to see Dr. Edwyna Shell in  1 week with a repeat chest x-ray.   FINAL DIAGNOSIS:  Left-sided pneumothorax following left lower lobe  superior segmentectomy, status post placement of chest tube.   OTHER DIAGNOSIS:  As previously dictated in the history and physical,  and discharge summary during last hospitalization.      Rowe Clack, P.A.-C.      Ines Bloomer, M.D.  Electronically Signed    WEG/MEDQ  D:  11/12/2008  T:  11/13/2008  Job:  [id number]

## 2010-10-17 NOTE — Op Note (Signed)
NAMEEDA, MAGNUSSEN              ACCOUNT NO.:  [id number]   MEDICAL RECORD NO.:  [id number]          PATIENT TYPE:  INP   LOCATION:  3301                         FACILITY:  [facility]   PHYSICIAN:  Ines Bloomer, M.D. DATE OF BIRTH:  01-Nov-1945   DATE OF PROCEDURE:  11/08/2008  DATE OF DISCHARGE:                               OPERATIVE REPORT   PREOPERATIVE DIAGNOSIS:  Status post left segmentectomy, shortness of  breath with loculated left hydrothorax.   POSTOPERATIVE DIAGNOSIS:  Status post left segmentectomy, shortness of  breath with loculated left hydrothorax.   OPERATION PERFORMED:  Insertion of left chest tube.   ANESTHESIA:  IV sedation with Versed and fentanyl and 1% Xylocaine.   After prepping and draping the left chest, the anterior area where she  had the chest tube site, the chest tube sutures were removed and it was  infiltrated with 1% Xylocaine and then opened and through that was  inserted a #28 chest tube and sutured in place with 0 silk and then  connected to a Pleur-evac and a dry sterile dressing was applied.  The  patient drained about 800 mL of fluid.  The patient tolerated the  procedure well.      Ines Bloomer, M.D.  Electronically Signed     DPB/MEDQ  D:  11/08/2008  T:  11/09/2008  Job:  [id number]

## 2010-10-17 NOTE — H&P (Signed)
Linda Rangel, Linda Rangel              ACCOUNT NO.:  [id number]   MEDICAL RECORD NO.:  [id number]          PATIENT TYPE:  INP   LOCATION:                               FACILITY:  [facility]   PHYSICIAN:  Ines Bloomer, M.D. DATE OF BIRTH:  11-Jan-1946   DATE OF ADMISSION:  10/28/2008  DATE OF DISCHARGE:                              HISTORY & PHYSICAL   CHIEF COMPLAINT:  Left lower lobe lung nodule.   HISTORY OF PRESENT ILLNESS:  This patient quit smoking 6 weeks ago.  She  has been smoking a pack and half cigarettes for many years.  She had  chest x-ray that showed a left lower lobe nodule and a CT scan that  showed a left lower lobe nodule.  A PET scan was done, and this was  positive but no evidence of any spread.  She has had no hemoptysis,  fever, chills, or excessive sputum.  Her pulmonary function tests showed  an FVC of 3.23 with an FEV1 of 1.54 with a diffusion capacity of 41%.  The nodule was in the superior segment of the left lower lobe.  She is  admitted for a left lower lobectomy or segmentectomy.   MEDICATIONS:  Crestor 10 mg a day, Xyzal 5 mg a day, Nasonex 50 mcg a  day, Xanax, and Symbicort.   ALLERGIES:  1. VIOXX.  2. CELEBREX.  3. CODEINE.  4. BONIVA.  5. PREDNISONE.   She has allergic rhinitis, dyslipidemia.  She has colon polyps,  depression, osteoarthritis, hiatal hernia, GERD, chronic obstructive  pulmonary disease, tobacco abuse.  She had a hysterectomy previously and  a spinal fusion in 1998.   FAMILY HISTORY:  Positive for diabetes, hypercholesterolemia, and  hypertension.   SOCIAL HISTORY:  She is retired, married with children and  grandchildren, does not drink alcohol on a regular basis, quit smoking 6  weeks ago.   REVIEW OF SYSTEMS:  CARDIAC:  No angina or atrial fibrillation.  PULMONARY:  See history of present illness.  GASTROINTESTINAL:  She has  got a hiatal hernia and GERD.  GENITOURINARY:  No dysuria, kidney  disease, or frequent urination.   VASCULAR:  No claudication, DVT, or  TIAs.  NEUROLOGIC:  No dizziness, headaches, blackouts, or seizures.  MUSCULOSKELETAL:  No arthritis.  PSYCHIATRIC:  No depression or nervous.  EYE/ENT:  No change in her eyesight or hearing.  HEMATOLOGIC:  No  problems with bleeding, clotting disorders, or anemia.   PHYSICAL EXAMINATION:  GENERAL:  She is a thin Caucasian female, in no  acute distress.  VITAL SIGNS:  Her blood pressure is 132/84, pulse 89, respirations 18,  sats were 96%.  HEENT:  Head is atraumatic.  Eyes, pupils equal and reactive to light  and accommodation.  Extraocular movements are normal.  Ear, tympanic  membranes are intact.  Nose, there is no septal deviation.  Throat  without lesion.  NECK:  Supple without thyromegaly.  There is no supraclavicular or  axillary adenopathy.  CHEST:  Clear to auscultation and percussion.  HEART:  Regular sinus rhythm.  No murmurs.  ABDOMEN:  Soft.  There is no hepatosplenomegaly.  EXTREMITIES:  Pulses are 2+.  There is no clubbing or edema.  NEUROLOGIC:  She is oriented x3.  Sensory and motor intact.  Cranial  nerves intact.   IMPRESSION:  1. Left lower lobe lesion.  2. Rule out lung cancer.  3. History of tobacco abuse.  4. Dyslipidemia.  5. Osteoarthritis.  6. Hiatal hernia.  7. Chronic obstructive pulmonary disease.  8. Tobacco abuse.   PLAN:  Left VATS and left lower lobectomy or left lower lobe  segmentectomy.      Ines Bloomer, M.D.  Electronically Signed     DPB/MEDQ  D:  10/26/2008  T:  10/27/2008  Job:  [id number]

## 2010-10-17 NOTE — Assessment & Plan Note (Signed)
OFFICE VISIT   [name], Linda Rangel  DOB:  [birth year]                                        December 08, 2008  CHART #:  [id number]   The patient came for followup today.  She is doing well.  Her chest x-  ray looks good.  Her incisions are well healed, and I will see her back  again in 6 weeks with a chest x-ray.  She has been somewhat emotionally  labile, but is improving.  Her cultures were apparently all negative,  since it has been over 6 weeks.  I told her to continue her same  medications.  She is slowly reducing her pain medication.  Blood  pressure was 116/74, pulse 80, respirations 16, and sats were 94%.   Ines Bloomer, M.D.  Electronically Signed   DPB/MEDQ  D:  12/08/2008  T:  12/09/2008  Job:  [id number]

## 2010-10-17 NOTE — Op Note (Signed)
[name], Linda Rangel              ACCOUNT NO.:  [id number]   MEDICAL RECORD NO.:  [id number]          PATIENT TYPE:  INP   LOCATION:  2311                         FACILITY:  [facility]   PHYSICIAN:  Ines Bloomer, M.D. DATE OF BIRTH:  06/08/1945   DATE OF PROCEDURE:  10/28/2008  DATE OF DISCHARGE:                               OPERATIVE REPORT   PREOPERATIVE DIAGNOSIS:  Left lower lobe superior segment lesion with  positive lung PET.   POSTOPERATIVE DIAGNOSIS:  Noncaseating granuloma, left lower lobe.   SURGEON:  Ines Bloomer, MD   OPERATION PERFORMED:  Left VATS minithoracotomy, left lower lobe  superior segment.   FIRST ASSISTANT:  Stephanie Acre. Dasovich, PA   ANESTHESIA:  General.   PROCEDURE:  After percutaneous insertion of all monitor lines, the  patient underwent general anesthesia, was turned to the right lateral  thoracotomy position, was prepped and draped in usual sterile manner.  Two trocar sites were made, one in the anterior axillary lines in the  seventh intercostal space and one in the posterior axillary lines in the  eighth intercostal space.  Two trocars were inserted and the lesion  could be seen in the superior segment of the left lower lobe, the  patient had marked amount of bullous emphysema.  We did a 4-5 cm  incision laterally in which we partially divided the latissimus.  The  serratus was reflected laterally and entered the sixth interspace.  Thus, we could palpate the lesion and came right down on the lesion.  We  started dissecting the fissure and dissected at the superior segmental  branch and stapled and divided with another suture, 2-mm stapler.  We  then partially divided the fissure with the Autosuture 3.2 blue stapler  dividing the upper portion of the fissure.  We then identified the  superior segmental bronchus and then decided because of the __________  we used the Duet stapler.  We started laterally and came across the  segment with  several applications of the Duet 60-mm stapler using the  last one to divide the bronchus.  The segment was removed and frozen  section revealed a noncaseating granulomatous process, which the  pathologist were asked to send the culture.  Wounds along was  reexpanded.  There was small air leak superiorly and CoSeal was applied  to the this air leak.  Two chest tubes were brought into the trocar  sites and Marcaine block was done in the usual fashion.  The chest was  closed with 3 pericostals drilling through the seventh rib passing  around the sixth rib, #1 Vicryl in the muscle layer and Ethicon skin  clips.  The patient was returned to the recovery room in stable  condition.      Ines Bloomer, M.D.  Electronically Signed     DPB/MEDQ  D:  10/28/2008  T:  10/29/2008  Job:  [id number]

## 2010-10-17 NOTE — Letter (Signed)
Oct 06, 2008   Charlcie Cradle. Delford Field, MD, FCCP  [address]  [place], [place] [address]   Re:  TAYLYN, BRAME                DOB:  05-09-1946   Dear Dennie Bible,   I saw the patient in the office today for evaluation of a left lower  lobe nodule.  A PET scan revealed that this is positive with no evidence  of spread.  She quit smoking just recently about 6 weeks ago.  She  smoked a pack and a half cigarettes a day for many years.  She has had  no hemoptysis, fever, chills, or excessive sputum.  Her FVC is 3.234  with an FEV-1 of 1.51.  However, DLCO was 41%.   Her medications are Crestor 10 mg a day, Xyzal 5 mg a day, Nasonex 50  mcg.  She has allergies, dyslipidemia.  She also takes Xanax.  She also  is on Symbicort.   ALLERGIES:  Vioxx, Celebrex, codeine, Boniva, and prednisone.   She has had previously rhinitis.  She had colon polyps, depression,  hyperlipidemia, osteoarthritis, hiatal hernia, GERD, and chronic  obstructive pulmonary disease with tobacco abuse.  She has had previous  spinal fusion in 1998 and hysterectomy.   FAMILY HISTORY:  Positive for diabetes, hypercholesterolemia,  hypertension.   SOCIAL HISTORY:  He is retired.  He is married, has several children and  grandchildren.  Does not drink alcohol on a regular basis.   REVIEW OF SYSTEMS:  Cardiac:  No angina or atrial fibrillation.  Pulmonary:  See history of present illness.  GI:  Hiatal hernia.  GU:  No kidney disease, dysuria, or frequent urination.  Vascular:  No  claudication, DVT, or TIAs.  Neurological:  No dizziness, headaches,  blackouts, or seizures.  Musculoskeletal:  No arthritis.  Psychiatric:  No depression or nervousness.  Eyes/ENT:  No changes in his eyesight or  hearing.  Hematological:  No problems with bleeding or clotting  disorders.   PHYSICAL EXAMINATION:  GENERAL:  She is a thin Caucasian female in no  acute distress.  VITAL SIGNS:  Her blood pressure is 132/84, pulse 99, respirations 18,  and saturations were 96%.  HEAD, EYES, EARS, NOSE, AND THROAT:  Unremarkable.  NECK:  Supple without thyromegaly.  There is no supraclavicular or  axillary adenopathy.  CHEST:  Clear to auscultation and percussion.  HEART:  Regular sinus rhythm.  No murmurs.  ABDOMEN:  Soft.  There is no hepatosplenomegaly.  EXTREMITIES:  Pulses are 2+.  There is no clubbing or edema.  NEUROLOGICAL:  She is oriented x3.  Sensory and motor intact.   I think the patient has got a left lower lobe lesion that is a minimal  to VATS left lower lobectomy.  I have tentatively scheduled for surgery  in approximately 2 weeks.  I explained the risks and benefits of surgery  and she agrees.  I appreciate the opportunity of seeing the patient.   Sincerely,   Ines Bloomer, M.D.  Electronically Signed   DPB/MEDQ  D:  10/06/2008  T:  10/07/2008  Job:  [id number]   cc:   Barbette Hair. Artist Pais, DO

## 2010-11-10 ENCOUNTER — Other Ambulatory Visit: Payer: Self-pay | Admitting: Internal Medicine

## 2010-11-10 DIAGNOSIS — Z780 Asymptomatic menopausal state: Secondary | ICD-10-CM

## 2010-11-10 DIAGNOSIS — Z1231 Encounter for screening mammogram for malignant neoplasm of breast: Secondary | ICD-10-CM

## 2010-11-13 ENCOUNTER — Ambulatory Visit
Admission: RE | Admit: 2010-11-13 | Discharge: 2010-11-13 | Disposition: A | Discharge status: Home / Self Care | Payer: 59 | Source: Ambulatory Visit | Attending: Internal Medicine | Admitting: Internal Medicine

## 2010-11-13 DIAGNOSIS — Z780 Asymptomatic menopausal state: Secondary | ICD-10-CM

## 2010-11-13 DIAGNOSIS — Z1231 Encounter for screening mammogram for malignant neoplasm of breast: Secondary | ICD-10-CM

## 2010-11-20 ENCOUNTER — Ambulatory Visit: Payer: 59

## 2010-11-20 ENCOUNTER — Other Ambulatory Visit: Payer: 59

## 2012-01-08 ENCOUNTER — Telehealth: Payer: Self-pay | Admitting: Critical Care Medicine

## 2012-01-08 NOTE — Telephone Encounter (Signed)
Called pt to schedule follow up apt x3.Sent letter 12/31/11. ° °

## 2012-01-22 ENCOUNTER — Other Ambulatory Visit: Payer: Self-pay | Admitting: Internal Medicine

## 2012-01-22 DIAGNOSIS — Z1231 Encounter for screening mammogram for malignant neoplasm of breast: Secondary | ICD-10-CM

## 2012-02-21 ENCOUNTER — Ambulatory Visit: Payer: 59

## 2012-02-25 ENCOUNTER — Ambulatory Visit
Admission: RE | Admit: 2012-02-25 | Discharge: 2012-02-25 | Disposition: A | Discharge status: Home / Self Care | Payer: 59 | Source: Ambulatory Visit | Attending: Internal Medicine | Admitting: Internal Medicine

## 2012-02-25 DIAGNOSIS — Z1231 Encounter for screening mammogram for malignant neoplasm of breast: Secondary | ICD-10-CM

## 2013-09-30 ENCOUNTER — Telehealth: Payer: Self-pay

## 2013-09-30 NOTE — Telephone Encounter (Signed)
Per last Colonoscopy report in 2010 from Dr. Jarold MottoPatterson patient has tortuous and redundant colon. Per Dr. Russella DarStark after reviewing the recall for April wanted me to call patient and ask her if she would like a Barium Enema instead of scheduling a Colonoscopy. Patient states [place] the beach [place] of [place] and would like to think about this decision. She states she will call our office when she has made a decision. Told patient that we will send out the recall to her as a reminder that she needs to either schedule a direct Colonoscopy or Barium Enema when she does call back. Pt agreed and verbalized understanding. Dr. Russella DarStark please fill out recall on your desk.

## 2013-10-01 ENCOUNTER — Encounter: Payer: Self-pay | Admitting: Gastroenterology

## 2014-02-23 ENCOUNTER — Encounter: Payer: Self-pay | Admitting: Gastroenterology

## 2014-07-29 ENCOUNTER — Other Ambulatory Visit: Payer: Self-pay | Admitting: Family Medicine

## 2014-07-29 DIAGNOSIS — Z1231 Encounter for screening mammogram for malignant neoplasm of breast: Secondary | ICD-10-CM

## 2014-08-04 ENCOUNTER — Ambulatory Visit
Admission: RE | Admit: 2014-08-04 | Discharge: 2014-08-04 | Disposition: A | Discharge status: Home / Self Care | Payer: Medicare Other | Source: Ambulatory Visit | Attending: Family Medicine | Admitting: Family Medicine

## 2014-08-04 DIAGNOSIS — Z1231 Encounter for screening mammogram for malignant neoplasm of breast: Secondary | ICD-10-CM

## 2015-06-08 ENCOUNTER — Encounter: Payer: Self-pay | Admitting: Gastroenterology

## 2016-07-15 ENCOUNTER — Emergency Department (HOSPITAL_COMMUNITY): Payer: Medicare Other

## 2016-07-15 ENCOUNTER — Encounter (HOSPITAL_COMMUNITY): Payer: Self-pay

## 2016-07-15 ENCOUNTER — Inpatient Hospital Stay (HOSPITAL_COMMUNITY)
Admission: EM | Admit: 2016-07-15 | Discharge: 2016-07-20 | DRG: 535 | Disposition: A | Discharge status: Skilled Nursing Facility | Payer: Medicare Other | Attending: Internal Medicine | Admitting: Internal Medicine

## 2016-07-15 DIAGNOSIS — F411 Generalized anxiety disorder: Secondary | ICD-10-CM | POA: Diagnosis present

## 2016-07-15 DIAGNOSIS — E860 Dehydration: Secondary | ICD-10-CM | POA: Diagnosis present

## 2016-07-15 DIAGNOSIS — R000 Tachycardia, unspecified: Secondary | ICD-10-CM

## 2016-07-15 DIAGNOSIS — T380X5A Adverse effect of glucocorticoids and synthetic analogues, initial encounter: Secondary | ICD-10-CM | POA: Diagnosis not present

## 2016-07-15 DIAGNOSIS — J449 Chronic obstructive pulmonary disease, unspecified: Secondary | ICD-10-CM

## 2016-07-15 DIAGNOSIS — M25552 Pain in left hip: Secondary | ICD-10-CM | POA: Diagnosis not present

## 2016-07-15 DIAGNOSIS — Z888 Allergy status to other drugs, medicaments and biological substances status: Secondary | ICD-10-CM | POA: Diagnosis not present

## 2016-07-15 DIAGNOSIS — D649 Anemia, unspecified: Secondary | ICD-10-CM | POA: Diagnosis present

## 2016-07-15 DIAGNOSIS — Z87891 Personal history of nicotine dependence: Secondary | ICD-10-CM

## 2016-07-15 DIAGNOSIS — Z885 Allergy status to narcotic agent status: Secondary | ICD-10-CM | POA: Diagnosis not present

## 2016-07-15 DIAGNOSIS — Z791 Long term (current) use of non-steroidal anti-inflammatories (NSAID): Secondary | ICD-10-CM

## 2016-07-15 DIAGNOSIS — M25512 Pain in left shoulder: Secondary | ICD-10-CM | POA: Diagnosis present

## 2016-07-15 DIAGNOSIS — J9601 Acute respiratory failure with hypoxia: Secondary | ICD-10-CM

## 2016-07-15 DIAGNOSIS — Y92230 Patient room in hospital as the place of occurrence of the external cause: Secondary | ICD-10-CM | POA: Diagnosis not present

## 2016-07-15 DIAGNOSIS — R410 Disorientation, unspecified: Secondary | ICD-10-CM | POA: Diagnosis present

## 2016-07-15 DIAGNOSIS — Y92019 Unspecified place in single-family (private) house as the place of occurrence of the external cause: Secondary | ICD-10-CM

## 2016-07-15 DIAGNOSIS — E876 Hypokalemia: Secondary | ICD-10-CM | POA: Diagnosis present

## 2016-07-15 DIAGNOSIS — Z79899 Other long term (current) drug therapy: Secondary | ICD-10-CM

## 2016-07-15 DIAGNOSIS — S32599A Other specified fracture of unspecified pubis, initial encounter for closed fracture: Secondary | ICD-10-CM | POA: Diagnosis present

## 2016-07-15 DIAGNOSIS — D72829 Elevated white blood cell count, unspecified: Secondary | ICD-10-CM | POA: Diagnosis not present

## 2016-07-15 DIAGNOSIS — S32592A Other specified fracture of left pubis, initial encounter for closed fracture: Secondary | ICD-10-CM | POA: Diagnosis present

## 2016-07-15 DIAGNOSIS — F419 Anxiety disorder, unspecified: Secondary | ICD-10-CM | POA: Diagnosis present

## 2016-07-15 DIAGNOSIS — R7989 Other specified abnormal findings of blood chemistry: Secondary | ICD-10-CM

## 2016-07-15 DIAGNOSIS — R319 Hematuria, unspecified: Secondary | ICD-10-CM | POA: Diagnosis present

## 2016-07-15 DIAGNOSIS — J439 Emphysema, unspecified: Secondary | ICD-10-CM | POA: Diagnosis not present

## 2016-07-15 DIAGNOSIS — N179 Acute kidney failure, unspecified: Secondary | ICD-10-CM | POA: Diagnosis present

## 2016-07-15 DIAGNOSIS — R0902 Hypoxemia: Secondary | ICD-10-CM

## 2016-07-15 DIAGNOSIS — T402X5A Adverse effect of other opioids, initial encounter: Secondary | ICD-10-CM | POA: Diagnosis not present

## 2016-07-15 DIAGNOSIS — R310 Gross hematuria: Secondary | ICD-10-CM | POA: Diagnosis not present

## 2016-07-15 DIAGNOSIS — Y658 Other specified misadventures during surgical and medical care: Secondary | ICD-10-CM | POA: Diagnosis not present

## 2016-07-15 DIAGNOSIS — W19XXXA Unspecified fall, initial encounter: Secondary | ICD-10-CM

## 2016-07-15 DIAGNOSIS — W010XXA Fall on same level from slipping, tripping and stumbling without subsequent striking against object, initial encounter: Secondary | ICD-10-CM | POA: Diagnosis present

## 2016-07-15 HISTORY — DX: Chronic obstructive pulmonary disease, unspecified: J44.9

## 2016-07-15 LAB — CBC WITH DIFFERENTIAL/PLATELET
Basophils Absolute: 0 10*3/uL (ref 0.0–0.1)
Basophils Relative: 0 %
Eosinophils Absolute: 0 10*3/uL (ref 0.0–0.7)
Eosinophils Relative: 0 %
HCT: 40.9 % (ref 36.0–46.0)
Hemoglobin: 14 g/dL (ref 12.0–15.0)
LYMPHS PCT: 11 %
Lymphs Abs: 0.9 10*3/uL (ref 0.7–4.0)
MCH: 31.7 pg (ref 26.0–34.0)
MCHC: 34.2 g/dL (ref 30.0–36.0)
MCV: 92.7 fL (ref 78.0–100.0)
MONOS PCT: 8 %
Monocytes Absolute: 0.6 10*3/uL (ref 0.1–1.0)
NEUTROS PCT: 81 %
Neutro Abs: 6.8 10*3/uL (ref 1.7–7.7)
Platelets: 168 10*3/uL (ref 150–400)
RBC: 4.41 MIL/uL (ref 3.87–5.11)
RDW: 13.5 % (ref 11.5–15.5)
WBC: 8.4 10*3/uL (ref 4.0–10.5)

## 2016-07-15 LAB — BASIC METABOLIC PANEL
Anion gap: 9 (ref 5–15)
BUN: 15 mg/dL (ref 6–20)
CO2: 26 mmol/L (ref 22–32)
CREATININE: 1.51 mg/dL — AB (ref 0.44–1.00)
Calcium: 9 mg/dL (ref 8.9–10.3)
Chloride: 102 mmol/L (ref 101–111)
GFR calc non Af Amer: 34 mL/min — ABNORMAL LOW (ref >60)
GFR, EST AFRICAN AMERICAN: 39 mL/min — AB (ref >60)
Glucose, Bld: 117 mg/dL — ABNORMAL HIGH (ref 65–99)
POTASSIUM: 4 mmol/L (ref 3.5–5.1)
SODIUM: 137 mmol/L (ref 135–145)

## 2016-07-15 MED ORDER — TRAMADOL HCL 50 MG PO TABS
50.0000 mg | ORAL_TABLET | Freq: Two times a day (BID) | ORAL | Status: DC | PRN
  Administered 2016-07-15 – 2016-07-16 (×2): 50 mg via ORAL
  Filled 2016-07-15 (×3): qty 1

## 2016-07-15 MED ORDER — MORPHINE SULFATE (PF) 2 MG/ML IV SOLN
2.0000 mg | INTRAVENOUS | Status: DC | PRN
  Administered 2016-07-15 – 2016-07-16 (×5): 2 mg via INTRAVENOUS
  Filled 2016-07-15 (×5): qty 1

## 2016-07-15 MED ORDER — UMECLIDINIUM BROMIDE 62.5 MCG/INH IN AEPB
1.0000 | INHALATION_SPRAY | Freq: Every day | RESPIRATORY_TRACT | Status: DC
  Administered 2016-07-16 – 2016-07-20 (×5): 1 via RESPIRATORY_TRACT
  Filled 2016-07-15: qty 7

## 2016-07-15 MED ORDER — TRAMADOL HCL 50 MG PO TABS
50.0000 mg | ORAL_TABLET | Freq: Once | ORAL | Status: AC
  Administered 2016-07-15: 50 mg via ORAL
  Filled 2016-07-15: qty 1

## 2016-07-15 MED ORDER — ACETAMINOPHEN 650 MG RE SUPP: 650.0000 mg | Freq: Four times a day (QID) | RECTAL | Status: DC | PRN

## 2016-07-15 MED ORDER — PRAVASTATIN SODIUM 40 MG PO TABS
40.0000 mg | ORAL_TABLET | Freq: Every day | ORAL | Status: DC
  Administered 2016-07-15 – 2016-07-20 (×6): 40 mg via ORAL
  Filled 2016-07-15 (×6): qty 1

## 2016-07-15 MED ORDER — IBUPROFEN 200 MG PO TABS: 400.0000 mg | ORAL_TABLET | Freq: Four times a day (QID) | ORAL | Status: DC | PRN

## 2016-07-15 MED ORDER — SODIUM CHLORIDE 0.9 % IV SOLN
INTRAVENOUS | Status: DC
  Administered 2016-07-15: 19:00:00 via INTRAVENOUS

## 2016-07-15 MED ORDER — SENNA 8.6 MG PO TABS
1.0000 | ORAL_TABLET | Freq: Two times a day (BID) | ORAL | Status: DC
  Administered 2016-07-15 – 2016-07-20 (×9): 8.6 mg via ORAL
  Filled 2016-07-15 (×9): qty 1

## 2016-07-15 MED ORDER — ACETAMINOPHEN 500 MG PO TABS
1000.0000 mg | ORAL_TABLET | Freq: Once | ORAL | Status: AC
  Administered 2016-07-15: 1000 mg via ORAL
  Filled 2016-07-15: qty 2

## 2016-07-15 MED ORDER — ENOXAPARIN SODIUM 30 MG/0.3ML ~~LOC~~ SOLN
30.0000 mg | SUBCUTANEOUS | Status: DC
  Administered 2016-07-15: 30 mg via SUBCUTANEOUS
  Filled 2016-07-15: qty 0.3

## 2016-07-15 MED ORDER — MORPHINE SULFATE (PF) 4 MG/ML IV SOLN
4.0000 mg | Freq: Once | INTRAVENOUS | Status: AC
  Administered 2016-07-15: 4 mg via INTRAVENOUS
  Filled 2016-07-15: qty 1

## 2016-07-15 MED ORDER — ENOXAPARIN SODIUM 40 MG/0.4ML ~~LOC~~ SOLN: 40.0000 mg | SUBCUTANEOUS | Status: DC

## 2016-07-15 MED ORDER — GABAPENTIN 100 MG PO CAPS
100.0000 mg | ORAL_CAPSULE | Freq: Three times a day (TID) | ORAL | Status: DC
  Administered 2016-07-15: 100 mg via ORAL
  Filled 2016-07-15: qty 1

## 2016-07-15 MED ORDER — ACETAMINOPHEN 325 MG PO TABS
650.0000 mg | ORAL_TABLET | Freq: Four times a day (QID) | ORAL | Status: DC | PRN
  Administered 2016-07-15 – 2016-07-17 (×4): 650 mg via ORAL
  Filled 2016-07-15 (×4): qty 2

## 2016-07-15 MED ORDER — ONDANSETRON HCL 4 MG/2ML IJ SOLN: 4.0000 mg | Freq: Four times a day (QID) | INTRAMUSCULAR | Status: DC | PRN

## 2016-07-15 MED ORDER — ONDANSETRON HCL 4 MG PO TABS: 4.0000 mg | ORAL_TABLET | Freq: Four times a day (QID) | ORAL | Status: DC | PRN

## 2016-07-15 MED ORDER — ARFORMOTEROL TARTRATE 15 MCG/2ML IN NEBU
15.0000 ug | INHALATION_SOLUTION | Freq: Two times a day (BID) | RESPIRATORY_TRACT | Status: DC
Start: 2016-07-15 — End: 2016-07-21
  Administered 2016-07-15 – 2016-07-20 (×10): 15 ug via RESPIRATORY_TRACT
  Filled 2016-07-15 (×11): qty 2

## 2016-07-15 NOTE — H&P (Signed)
History and Physical    Linda LangtonScharla B Devall QMV:[id number] DOB: 12-Dec-1945  DOA: 07/15/2016 PCP: Ninetta LightsFURR,SARA, MD  Patient coming from: Home   Chief Complaint: Fall  HPI: Linda Rangel is a 71 y.o. female with medical history significant of COPD non O2 dependent presents to the ED after mechanical fall at home, she reported that she tripped on her feet and fell. Patient denies head injury, loss of consciousness or neck pain. Patient has pain in her left leg and left buttock which is severe and sharp in nature and worse with weightbearing or movement. She has no other complaints at this time.  ED Course: Pelvis x-ray show nondisplaced rupture of the left superior and inferior pubic rami. EDP discussed with Dr. Magnus IvanBlackman orthopedic surgery who advised pain control and WBAT. As patient was not able to tolerate ambulation, was called to place the patient on observation for physical therapy evaluation and pain control.  Review of Systems:   General: no changes in body weight, no fever chills or decrease in energy.  HEENT: no blurry vision, hearing changes or sore throat Respiratory: no dyspnea, coughing, wheezing CV: no chest pain, no palpitations GI: no nausea, vomiting, abdominal pain, diarrhea, constipation GU: no dysuria, burning on urination, increased urinary frequency, hematuria  Ext:. No deformities,  Neuro: no unilateral weakness, numbness, or tingling, no vision change or hearing loss Skin: No rashes, lesions or wounds. MSK:  see history of present illness Heme: No easy bruising.  Travel history: No recent long distant travel.   Past Medical History:  Diagnosis Date  . COPD (chronic obstructive pulmonary disease) (HCC)     History reviewed. No pertinent surgical history.   reports that she quit smoking about 7 years ago. She has never used smokeless tobacco. She reports that she does not drink alcohol or use drugs.  Allergies  Allergen Reactions  . Celecoxib   . Codeine     . Ibandronate Sodium     REACTION: increased arthritic pain  . Prednisone     REACTION: Generalized Swelling  . Rofecoxib     History reviewed. No pertinent family history.   Prior to Admission medications   Medication Sig Start Date End Date Taking? Authorizing Provider  gabapentin (NEURONTIN) 100 MG capsule Take 100 mg by mouth 3 (three) times daily. 06/19/16  Yes Historical Provider, MD  ibuprofen (ADVIL,MOTRIN) 200 MG tablet Take 400 mg by mouth every 6 (six) hours as needed.   Yes Historical Provider, MD  pravastatin (PRAVACHOL) 40 MG tablet Take 40 mg by mouth at bedtime. 06/19/16  Yes Historical Provider, MD  Tiotropium Bromide-Olodaterol (STIOLTO RESPIMAT) 2.5-2.5 MCG/ACT AERS Inhale 2 puffs into the lungs daily.   Yes Historical Provider, MD  buPROPion [facility](WELLBUTRIN SR) 150 MG 12 hr tablet TAKE 1 TABLET EVERY MORNING Patient not taking: Reported on 07/15/2016 08/25/10   Glennis BrinkKaren E Bowen, DO  SPIRIVA HANDIHALER 18 MCG inhalation capsule INHALE TWO PUFFS IN [facility] DA[facility] Patient not taking: Reported on 07/15/2016 08/29/10   Storm FriskPatrick E Wright, MD    Physical Exam: Vitals:   07/15/16 1132 07/15/16 1400 07/15/16 1409 07/15/16 1641  BP: 121/81 123/68 123/68   Pulse: 99 92 92   Resp: 16  (!) 31   Temp: 98 F (36.7 C)     TempSrc: Oral     SpO2: (!) 87% 94% 94%   Weight: 52.2 kg (115 lb)     Height:    5\' 3"  (1.6 m)     Constitutional: NAD Eyes:  PERRL, lids and conjunctivae normal ENMT: Mucous membranes are moist.  Neck: normal, supple, no masses, no thyromegaly Respiratory: clear to auscultation bilaterally, no wheezing, no crackles. Normal respiratory effort. Cardiovascular: Regular rate and rhythm, no murmurs / rubs / gallops. No extremity edema. 2+ pedal pulses. Abdomen: Soft nontender nondistended positive bowel sounds  Musculoskeletal: Moderate left lower extremity tenderness with range of motion movement. Tenderness to palpation at the left groin region. Right lower  extremity with good range of motion and no pain  Skin: no rashes, lesions, ulcers. No induration Neurologic: CN 2-12 grossly intact.  Psychiatric: Normal judgment and insight. Alert and oriented x 3. Normal mood.    Labs on Admission: I have personally reviewed following labs and imaging studies  CBC:  Recent Labs Lab 07/15/16 1419  WBC 8.4  NEUTROABS 6.8  HGB 14.0  HCT 40.9  MCV 92.7  PLT 168   Basic Metabolic Panel:  Recent Labs Lab 07/15/16 1419  NA 137  K 4.0  CL 102  CO2 26  GLUCOSE 117*  BUN 15  CREATININE 1.51*  CALCIUM 9.0   GFR: Estimated Creatinine Clearance: 28.2 mL/min (by C-G formula based on SCr of 1.51 mg/dL (H)).  Urine analysis:    Component Value Date/Time   COLORURINE YELLOW 10/26/2008 1409   APPEARANCEUR CLEAR 10/26/2008 1409   LABSPEC 1.015 10/26/2008 1409   PHURINE 5.5 10/26/2008 1409   GLUCOSEU NEGATIVE 10/26/2008 1409   HGBUR NEGATIVE 10/26/2008 1409   BILIRUBINUR NEGATIVE 10/26/2008 1409   KETONESUR NEGATIVE 10/26/2008 1409   PROTEINUR NEGATIVE 10/26/2008 1409   UROBILINOGEN 0.2 10/26/2008 1409   NITRITE NEGATIVE 10/26/2008 1409   LEUKOCYTESUR  10/26/2008 1409    NEGATIVE MICROSCOPIC NOT DONE ON URINES WITH NEGATIVE PROTEIN, BLOOD, LEUKOCYTES, NITRITE, OR GLUCOSE <1000 mg/dL.   Radiological Exams on Admission: Dg Chest 2 View  Result Date: 07/15/2016 CLINICAL DATA:  Chest wall pain EXAM: CHEST  2 VIEW COMPARISON:  06/05/2008 FINDINGS: There is hyperinflation of the lungs compatible with COPD. Heart and mediastinal contours are within normal limits. No focal opacities or effusions. No acute bony abnormality. IMPRESSION: COPD.  No active disease. Electronically Signed   By: Charlett Nose M.D.   On: 07/15/2016 13:17   Dg Shoulder Left  Result Date: 07/15/2016 CLINICAL DATA:  Left shoulder pain, status post fall 6 hours ago EXAM: LEFT SHOULDER - 2+ VIEW COMPARISON:  None. FINDINGS: No fracture or dislocation is seen. The joint spaces  are preserved. The visualized soft tissues are unremarkable. Visualized left lung is clear. IMPRESSION: Negative. Electronically Signed   By: Charline Bills M.D.   On: 07/15/2016 13:16   Dg Hip Unilat With Pelvis 2-3 Views Left  Result Date: 07/15/2016 CLINICAL DATA:  Status post fall, left hip pain EXAM: DG HIP (WITH OR WITHOUT PELVIS) 2-3V LEFT COMPARISON:  None. FINDINGS: There is no evidence of hip fracture or dislocation. There is a nondisplaced fracture of the left superior and inferior pubic rami. There is generalized osteopenia. There is no evidence of arthropathy or other focal bone abnormality. IMPRESSION: No acute osseous injury of the left hip. Nondisplaced fracture of the left superior and inferior pubic rami. Electronically Signed   By: Elige Ko   On: 07/15/2016 12:22   Assessment/Plan Pubic ramus fracture (HCC) - patient not able to ambulate due to severe pain. Admit to MedSurg for physical therapy evaluation and possible short-term rehabilitation placement. Pain control when necessary  AKI - baseline Cr 0.59 on 05/31/16 at [facility] - ?  Dehydration, NSAID use, patient take frequent ibuprofen for msk pain Will add IVF Check Cr in AM  FeNA   COPD - stable, no O2 dependent  Resume home medications  Anxiety - stable  Patient not taking any medications at this time   DVT prophylaxis: Lovenox  Code Status: FULL  Family Communication: Husband at bedside  Disposition Plan: Anticipate discharge to previous home environment.  Consults called: EDP discussed case with Orthopedic surgery  Admission status: Inpatient medsurg    Latrelle Dodrill MD [facility] Pager: Text Page via [email or web address]  [phone]  If 7PM-7AM, please contact night-coverage [email or web address] Password [facility]  07/15/2016, 5:08 PM

## 2016-07-15 NOTE — ED Notes (Signed)
Staff attempted to get the pt up to ambulate, pt was in too much pain to get up.

## 2016-07-15 NOTE — ED Notes (Signed)
Patient transported to X-ray 

## 2016-07-15 NOTE — ED Provider Notes (Signed)
Emergency Department Provider Note   I have reviewed the triage vital signs and the nursing notes.   HISTORY  Chief Complaint Fall   HPI Linda Rangel is a 71 y.o. female with PMH of COPD presents to the ED for evaluation of mechanical fall at home this AM. She tripped and fell without CP, SOB, palpitations, or lightheadedness. No recent medication changes. No recent fever, chills, vomiting, or diarrhea. She denies any head injury, neck pain, or LOC. Not anticoagulated. Did not take her COPD medication this AM. The pain is left lower abdomen/pelvis and non-radiating. Pain severe and made worse with movement.    Past Medical History:  Diagnosis Date  . COPD (chronic obstructive pulmonary disease) [facility])     Patient Active Problem List   Diagnosis Date Noted  . Pubic ramus fracture (HCC) 07/15/2016  . BENIGN PAROXYSMAL POSITIONAL VERTIGO 08/02/2010  . ACUTE MAXILLARY SINUSITIS 08/02/2010  . IRRITABLE BOWEL SYNDROME 05/16/2010  . GASTRITIS 05/01/2010  . DIVERTICULOSIS, COLON 04/05/2010  . COLONIC POLYPS, ADENOMATOUS, HX OF 04/05/2010  . ANAL PRURITUS 09/27/2009  . EPIGASTRIC PAIN 08/17/2009  . EUSTACHIAN TUBE DYSFUNCTION, LEFT 12/28/2008  . COPD 08/05/2008  . HIATAL HERNIA 08/05/2008  . HEMATURIA UNSPECIFIED 08/05/2008  . OSTEOPOROSIS 08/05/2008  . HYPERLIPIDEMIA 07/30/2008  . DEPRESSION 07/30/2008  . ALLERGIC RHINITIS 07/30/2008  . GERD 07/30/2008  . OSTEOARTHRITIS 07/30/2008    History reviewed. No pertinent surgical history.  Current Outpatient Rx  . Order #: [id number] Class: Historical Med  . Order #: [id number] Class: Historical Med  . Order #: [id number] Class: Historical Med  . Order #: [id number] Class: Historical Med  . Order #: [id number] Class: Normal  . Order #: [id number] Class: Normal    Allergies Celecoxib; Codeine; Ibandronate sodium; Prednisone; and Rofecoxib  No family history on file.  Social History Social History  Substance Use Topics  .  Smoking status: Former Smoker    Quit date: 07/15/2009  . Smokeless tobacco: Never Used  . Alcohol use No    Review of Systems  Constitutional: No fever/chills Eyes: No visual changes. ENT: No sore throat. Cardiovascular: Denies chest pain. Respiratory: Denies shortness of breath. Gastrointestinal: No abdominal pain.  No nausea, no vomiting.  No diarrhea.  No constipation. Genitourinary: Negative for dysuria. Musculoskeletal: Negative for back pain. Positive left leg pain and hip pain. Mild left shoulder pain.  Skin: Negative for rash. Neurological: Negative for headaches, focal weakness or numbness.  10-point ROS otherwise negative.  ____________________________________________   PHYSICAL EXAM:  VITAL SIGNS: ED Triage Vitals [07/15/16 1132]  Enc Vitals Group     BP 121/81     Pulse Rate 99     Resp 16     Temp 98 F (36.7 C)     Temp Source Oral     SpO2 (!) 87 %     Weight 115 lb (52.2 kg)   Constitutional: Alert and oriented. Well appearing and in no acute distress. Eyes: Conjunctivae are normal. Head: Atraumatic. Nose: No congestion/rhinnorhea. Mouth/Throat: Mucous membranes are moist.  Oropharynx non-erythematous. Neck: No stridor. No cervical spine tenderness to palpation. Cardiovascular: Normal rate, regular rhythm. Good peripheral circulation. Grossly normal heart sounds.   Respiratory: Normal respiratory effort.  No retractions. Lungs CTAB. Gastrointestinal: Soft and nontender. No distention.  Musculoskeletal: Moderate LLE tenderness with ROM. No shortening or abnormal rotation of the left leg. Left shoulder pain with ROM but no deformity. No elbow or wrist tenderness.  Neurologic:  Normal speech and language. No gross focal neurologic deficits are  appreciated.  Skin:  Skin is warm, dry and intact. No rash noted. Psychiatric: Mood and affect are normal. Speech and behavior are normal.  ____________________________________________   LABS (all labs ordered  are listed, but only abnormal results are displayed)  Labs Reviewed  BASIC METABOLIC PANEL - Abnormal; Notable for the following:       Result Value   Glucose, Bld 117 (*)    Creatinine, Ser 1.51 (*)    GFR calc non Af Amer 34 (*)    GFR calc Af Amer 39 (*)    All other components within normal limits  CBC WITH DIFFERENTIAL/PLATELET   ____________________________________________  RADIOLOGY  Dg Chest 2 View  Result Date: 07/15/2016 CLINICAL DATA:  Chest wall pain EXAM: CHEST  2 VIEW COMPARISON:  06/05/2008 FINDINGS: There is hyperinflation of the lungs compatible with COPD. Heart and mediastinal contours are within normal limits. No focal opacities or effusions. No acute bony abnormality. IMPRESSION: COPD.  No active disease. Electronically Signed   By: Charlett Nose M.D.   On: 07/15/2016 13:17   Dg Shoulder Left  Result Date: 07/15/2016 CLINICAL DATA:  Left shoulder pain, status post fall 6 hours ago EXAM: LEFT SHOULDER - 2+ VIEW COMPARISON:  None. FINDINGS: No fracture or dislocation is seen. The joint spaces are preserved. The visualized soft tissues are unremarkable. Visualized left lung is clear. IMPRESSION: Negative. Electronically Signed   By: Charline Bills M.D.   On: 07/15/2016 13:16   Dg Hip Unilat With Pelvis 2-3 Views Left  Result Date: 07/15/2016 CLINICAL DATA:  Status post fall, left hip pain EXAM: DG HIP (WITH OR WITHOUT PELVIS) 2-3V LEFT COMPARISON:  None. FINDINGS: There is no evidence of hip fracture or dislocation. There is a nondisplaced fracture of the left superior and inferior pubic rami. There is generalized osteopenia. There is no evidence of arthropathy or other focal bone abnormality. IMPRESSION: No acute osseous injury of the left hip. Nondisplaced fracture of the left superior and inferior pubic rami. Electronically Signed   By: Elige Ko   On: 07/15/2016 12:22    ____________________________________________   PROCEDURES  Procedure(s) performed:    Procedures  None ____________________________________________   INITIAL IMPRESSION / ASSESSMENT AND PLAN / ED COURSE  Pertinent labs & imaging results that were available during my care of the patient were reviewed by me and considered in my medical decision making (see chart for details).  Patient resents to the emergency department for evaluation of mechanical fall at home with left shoulder and hip pain. Patient with tenderness to range of motion of the left hip. No numbness or tingling. Plain film shows a nondisplaced fracture of the left superior pubic ramus which is likely the source of her pain.    01:54 PM Spoke with Dr. Magnus Ivan with Ortho who advises pain control and WBAT. No ortho f/u necessary. Patient with continued pain after x-ray. No change after Tylenol and Tramadol. Plan for Morphine and will attempt ambulation. Patient may require admission for pain control and PT but will reassess after stronger medication.   02:57 PM Patient is unable to tolerate even sitting up in bed in much less walking after morphine. Will call hospitalist to discuss admission for pain control and physical therapy. Discussed plan with patient in detail.   Discussed patient's case with Hospitalist. Patient and family (if present) updated with plan. Care transferred to Hospitalist service.  I reviewed all nursing notes, vitals, pertinent old records, EKGs, labs, imaging (as available).  ____________________________________________  FINAL CLINICAL IMPRESSION(S) / ED DIAGNOSES  Final diagnoses:  Fall, initial encounter  Closed fracture of multiple rami of left pubis, initial encounter (HCC)     MEDICATIONS GIVEN DURING THIS VISIT:  Medications  traMADol (ULTRAM) tablet 50 mg (50 mg Oral Given 07/15/16 1315)  acetaminophen (TYLENOL) tablet 1,000 mg (1,000 mg Oral Given 07/15/16 1315)  morphine 4 MG/ML injection 4 mg (4 mg Intravenous Given 07/15/16 1417)     NEW OUTPATIENT MEDICATIONS  STARTED DURING THIS VISIT:  None   Note:  This document was prepared using Dragon voice recognition software and may include unintentional dictation errors.  Alona BeneJoshua Zakiyah Diop, MD Emergency Medicine   Maia PlanJoshua G Demetrice Combes, MD 07/15/16 [id number]

## 2016-07-15 NOTE — ED Triage Notes (Signed)
Patient bib [facility].  Patient from home, tripped today landing on her left side landing on left hip.  Patient also c/o left shoulder pain.  No obvious deformity or shortening present.  Patient A&O x4, NAD at this time.  Patient with Hx of COPD and has not taken her daily inhaler today.

## 2016-07-15 NOTE — ED Notes (Signed)
Dressed patients skin tear to right wrist.  Cleaned and dressed with bandage.

## 2016-07-15 NOTE — ED Notes (Signed)
Bed: WA20 Expected date:  Expected time:  Means of arrival:  Comments: 71 yo fall; L hip pain

## 2016-07-15 NOTE — ED Triage Notes (Signed)
Patient denies hitting head,  denies LOC, denies blood thinners.

## 2016-07-16 ENCOUNTER — Inpatient Hospital Stay (HOSPITAL_COMMUNITY): Payer: Medicare Other

## 2016-07-16 LAB — BASIC METABOLIC PANEL
Anion gap: 11 (ref 5–15)
Anion gap: 7 (ref 5–15)
BUN: 26 mg/dL — ABNORMAL HIGH (ref 6–20)
BUN: 17 mg/dL (ref 6–20)
CALCIUM: 8.8 mg/dL — AB (ref 8.9–10.3)
CALCIUM: 7.9 mg/dL — AB (ref 8.9–10.3)
CO2: 23 mmol/L (ref 22–32)
CO2: 23 mmol/L (ref 22–32)
CREATININE: 0.89 mg/dL (ref 0.44–1.00)
Chloride: 103 mmol/L (ref 101–111)
Chloride: 106 mmol/L (ref 101–111)
Creatinine, Ser: 2.62 mg/dL — ABNORMAL HIGH (ref 0.44–1.00)
GFR calc Af Amer: 20 mL/min — ABNORMAL LOW (ref >60)
GFR calc Af Amer: >60 mL/min (ref >60)
GFR calc non Af Amer: >60 mL/min (ref >60)
GFR, EST NON AFRICAN AMERICAN: 17 mL/min — AB (ref >60)
GLUCOSE: 137 mg/dL — AB (ref 65–99)
Glucose, Bld: 153 mg/dL — ABNORMAL HIGH (ref 65–99)
Potassium: 4.3 mmol/L (ref 3.5–5.1)
Potassium: 3.5 mmol/L (ref 3.5–5.1)
Sodium: 137 mmol/L (ref 135–145)
Sodium: 136 mmol/L (ref 135–145)

## 2016-07-16 LAB — URINALYSIS, ROUTINE W REFLEX MICROSCOPIC
BACTERIA UA: NONE SEEN
Bilirubin Urine: NEGATIVE
Glucose, UA: NEGATIVE mg/dL
Ketones, ur: NEGATIVE mg/dL
Nitrite: NEGATIVE
PROTEIN: 100 mg/dL — AB
SQUAMOUS EPITHELIAL / LPF: NONE SEEN
Specific Gravity, Urine: 1.024 (ref 1.005–1.030)
Squamous Epithelial / LPF: NONE SEEN
pH: 5 (ref 5.0–8.0)

## 2016-07-16 LAB — CBC
HCT: 40.5 % (ref 36.0–46.0)
HCT: 36.3 % (ref 36.0–46.0)
Hemoglobin: 13.6 g/dL (ref 12.0–15.0)
Hemoglobin: 12.2 g/dL (ref 12.0–15.0)
MCH: 31.5 pg (ref 26.0–34.0)
MCH: 30.8 pg (ref 26.0–34.0)
MCHC: 33.6 g/dL (ref 30.0–36.0)
MCHC: 33.6 g/dL (ref 30.0–36.0)
MCV: 93.8 fL (ref 78.0–100.0)
MCV: 91.7 fL (ref 78.0–100.0)
PLATELETS: 167 10*3/uL (ref 150–400)
PLATELETS: 146 10*3/uL — AB (ref 150–400)
RBC: 4.32 MIL/uL (ref 3.87–5.11)
RBC: 3.96 MIL/uL (ref 3.87–5.11)
RDW: 13.7 % (ref 11.5–15.5)
RDW: 13.5 % (ref 11.5–15.5)
WBC: 11 10*3/uL — ABNORMAL HIGH (ref 4.0–10.5)
WBC: 6.4 10*3/uL (ref 4.0–10.5)

## 2016-07-16 LAB — CREATININE, URINE, RANDOM: Creatinine, Urine: 209.06 mg/dL

## 2016-07-16 LAB — PROTIME-INR
INR: 1.06
Prothrombin Time: 13.8 seconds (ref 11.4–15.2)

## 2016-07-16 LAB — SODIUM, URINE, RANDOM: SODIUM UR: 80 mmol/L

## 2016-07-16 MED ORDER — ALBUTEROL SULFATE (2.5 MG/3ML) 0.083% IN NEBU
2.5000 mg | INHALATION_SOLUTION | RESPIRATORY_TRACT | Status: DC
  Administered 2016-07-16: 2.5 mg via RESPIRATORY_TRACT
  Filled 2016-07-16: qty 3

## 2016-07-16 MED ORDER — ALBUTEROL SULFATE (2.5 MG/3ML) 0.083% IN NEBU
2.5000 mg | INHALATION_SOLUTION | RESPIRATORY_TRACT | Status: DC | PRN
Start: 2016-07-16 — End: 2016-07-17

## 2016-07-16 MED ORDER — SODIUM CHLORIDE 0.9 % IV BOLUS (SEPSIS)
1000.0000 mL | Freq: Once | INTRAVENOUS | Status: AC
  Administered 2016-07-16: 1000 mL via INTRAVENOUS

## 2016-07-16 MED ORDER — SODIUM CHLORIDE 0.9 % IV BOLUS (SEPSIS)
500.0000 mL | Freq: Once | INTRAVENOUS | Status: AC
  Administered 2016-07-16: 500 mL via INTRAVENOUS

## 2016-07-16 MED ORDER — SODIUM CHLORIDE 0.9 % IV SOLN
INTRAVENOUS | Status: DC
  Administered 2016-07-16: 11:00:00 via INTRAVENOUS

## 2016-07-16 MED ORDER — ENSURE ENLIVE PO LIQD
237.0000 mL | Freq: Two times a day (BID) | ORAL | Status: DC
  Administered 2016-07-16 – 2016-07-20 (×5): 237 mL via ORAL

## 2016-07-16 MED ORDER — ALBUTEROL SULFATE (2.5 MG/3ML) 0.083% IN NEBU
2.5000 mg | INHALATION_SOLUTION | Freq: Four times a day (QID) | RESPIRATORY_TRACT | Status: DC
  Administered 2016-07-16: 2.5 mg via RESPIRATORY_TRACT
  Filled 2016-07-16: qty 3

## 2016-07-16 MED ORDER — SODIUM CHLORIDE 0.9 % IV SOLN
INTRAVENOUS | Status: DC
  Administered 2016-07-16: 18:00:00 via INTRAVENOUS

## 2016-07-16 NOTE — Progress Notes (Signed)
Pt was confused around 2300 last pm.  Asked about voiding and she could not remember if voided in ED.  I bladder scanned her and it only showed 30cc.  I ran IV fluid at 50cc all night.  At 0600 she still had not voided, I scanned again and it showed 40cc.  NP notified, in the meantime her labs came back with elevated BUN/Creat which is new for her.  Foley and fluid bolus ordered and performed.  75cc dark thick bloody urine obtained, specimen sent for NA Creatinine and osmality per previous order. Lab holding specimen in case new orders are written.  Family educateed  Next shift updated. Mertha Clyatt P Alondra Sahni

## 2016-07-16 NOTE — Progress Notes (Signed)
PROGRESS NOTE  Linda Rangel  ZOX:[id number] DOB: February 18, 1946 DOA: 07/15/2016 PCP: Ninetta Lights, MD  Brief Narrative:   Linda Rangel is a 71 y.o. female with history of COPD non O2 dependent who presented to the ED after mechanical fall at home.  She reported that she stood up from the sofa and her foot slipped on the hardwood floor. She denied head injury, loss of consciousness or neck pain. Patient had pain in her left leg and left buttock which was severe and pelvis x-ray confirmed a nondisplaced rupture of the left superior and inferior pubic rami.  Dr. Magnus Ivan orthopedic surgery advised pain control and WBAT, but she was unable to tolerate ambulation.  Additionally, she developed AKI.  Foley catheter was placed and she had vibrant hematuria.    Assessment & Plan:   Active Problems:   Pubic ramus fracture (HCC)   Chronic obstructive pulmonary disease (HCC)   AKI (acute kidney injury) (HCC)   Generalized anxiety disorder  Pubic ramus fracture (HCC) - patient not able to ambulate due to severe pain.   -  Attempt to transition to oral pain medication -  PT assessment appreciated -  SW consult placed for SNF  Acute respiratory failure with hypoxia.  May be due to acute lung disease from IVF vs. COPD or atelectasis -  No fever or leukocytosis to suggest pneumonia -  CXR -  O2 prn -  Schedule albuterol -  Hold IVF pending CXR   AKI - baseline Cr 0.59 on 05/31/16 at [facility] - up to 2.62 on 2/12.  Dehydration, NSAID use suspected.  Hematuria after foley placement this morning.  Had not previously had hematuria and no abdominal or flank plan to suggest recent kidney stones.  No recent dysuria or changes in urinary frequency or urgency to suggest UTI.   -  Low uop overnight and difficulty voiding this morning (likely due to narcotics) -  Foley placed -  Flush foley -  UA with copious WBC and RBC -  Urine culture pending -  Holding IVF temporarily due to hypoxia, but may resume if CXR  clear -  Repeat BMP this afternoon -  Repeat BMP in AM  COPD with acute respiratory failure with hypoxia -  Continue brovana and ellipta -  Start scheduled albuterol  Anxiety - stable  Patient not taking any medications at this time   DVT prophylaxis:  SCDs Code Status:  Full code Family Communication:  Patient and her husband at bedside Disposition Plan:  To SNF   Consultants:   Orthopedic surgery, Dr. Magnus Ivan   Procedures:  none  Antimicrobials:  Anti-infectives    None       Subjective: Ongoing pain in the left buttock making ambulation difficult.  Denies recent dysuria, urinary frequency or urgency.  No change to the color or volume of her urine recently.  Denies abdominal or flank pain.    Objective: Vitals:   07/15/16 2100 07/16/16 0438 07/16/16 0923 07/16/16 1346  BP: (!) 149/85 (!) 149/81  111/67  Pulse: (!) 103 (!) 111  (!) 113  Resp: 20 17  20   Temp: 97.8 F (36.6 C) 98.2 F (36.8 C)  98.1 F (36.7 C)  TempSrc: Oral Oral  Oral  SpO2: 90% 98% 93% (!) 82%  Weight:      Height:        Intake/Output Summary (Last 24 hours) at 07/16/16 1414 Last data filed at 07/16/16 1008  Gross per 24 hour  Intake  658.33 ml  Output              325 ml  Net           333.33 ml   Filed Weights   07/15/16 1132  Weight: 52.2 kg (115 lb)    Examination:  General exam:  Thin adult female, no acute distress.  No acute distress.  HEENT:  NCAT, MMM Respiratory system:  Diminished bilateral breath sounds, no focal rales, rhonchi, or wheeze Cardiovascular system: Regular rate and rhythm, normal S1/S2. No murmurs, rubs, gallops or clicks.  Warm extremities Gastrointestinal system: Normal active bowel sounds, soft, nondistended, nontender. MSK:  Normal tone and bulk, no lower extremity edema Neuro:  Grossly moves all extremities    Data Reviewed: I have personally reviewed following labs and imaging studies  CBC:  Recent Labs Lab 07/15/16 1419  07/16/16 0508  WBC 8.4 11.0*  NEUTROABS 6.8  --   HGB 14.0 13.6  HCT 40.9 40.5  MCV 92.7 93.8  PLT 168 167   Basic Metabolic Panel:  Recent Labs Lab 07/15/16 1419 07/16/16 0508  NA 137 137  K 4.0 4.3  CL 102 103  CO2 26 23  GLUCOSE 117* 137*  BUN 15 26*  CREATININE 1.51* 2.62*  CALCIUM 9.0 8.8*   GFR: Estimated Creatinine Clearance: 16.2 mL/min (by C-G formula based on SCr of 2.62 mg/dL (H)). Liver Function Tests: No results for input(s): AST, ALT, ALKPHOS, BILITOT, PROT, ALBUMIN in the last 168 hours. No results for input(s): LIPASE, AMYLASE in the last 168 hours. No results for input(s): AMMONIA in the last 168 hours. Coagulation Profile: No results for input(s): INR, PROTIME in the last 168 hours. Cardiac Enzymes: No results for input(s): CKTOTAL, CKMB, CKMBINDEX, TROPONINI in the last 168 hours. BNP (last 3 results) No results for input(s): PROBNP in the last 8760 hours. HbA1C: No results for input(s): HGBA1C in the last 72 hours. CBG: No results for input(s): GLUCAP in the last 168 hours. Lipid Profile: No results for input(s): CHOL, HDL, LDLCALC, TRIG, CHOLHDL, LDLDIRECT in the last 72 hours. Thyroid Function Tests: No results for input(s): TSH, T4TOTAL, FREET4, T3FREE, THYROIDAB in the last 72 hours. Anemia Panel: No results for input(s): VITAMINB12, FOLATE, FERRITIN, TIBC, IRON, RETICCTPCT in the last 72 hours. Urine analysis:    Component Value Date/Time   COLORURINE RED (A) 07/16/2016 0630   APPEARANCEUR TURBID (A) 07/16/2016 0630   LABSPEC  07/16/2016 0630    TEST NOT REPORTED DUE TO COLOR INTERFERENCE OF URINE PIGMENT   PHURINE  07/16/2016 0630    TEST NOT REPORTED DUE TO COLOR INTERFERENCE OF URINE PIGMENT   GLUCOSEU (A) 07/16/2016 0630    TEST NOT REPORTED DUE TO COLOR INTERFERENCE OF URINE PIGMENT   HGBUR (A) 07/16/2016 0630    TEST NOT REPORTED DUE TO COLOR INTERFERENCE OF URINE PIGMENT   BILIRUBINUR (A) 07/16/2016 0630    TEST NOT REPORTED  DUE TO COLOR INTERFERENCE OF URINE PIGMENT   KETONESUR (A) 07/16/2016 0630    TEST NOT REPORTED DUE TO COLOR INTERFERENCE OF URINE PIGMENT   PROTEINUR (A) 07/16/2016 0630    TEST NOT REPORTED DUE TO COLOR INTERFERENCE OF URINE PIGMENT   UROBILINOGEN 0.2 10/26/2008 1409   NITRITE (A) 07/16/2016 0630    TEST NOT REPORTED DUE TO COLOR INTERFERENCE OF URINE PIGMENT   LEUKOCYTESUR (A) 07/16/2016 0630    TEST NOT REPORTED DUE TO COLOR INTERFERENCE OF URINE PIGMENT   Sepsis Labs: @LABRCNTIP (procalcitonin:4,lacticidven:4)  )No  results found for this or any previous visit (from the past 240 hour(s)).    Radiology Studies: Dg Chest 2 View  Result Date: 07/15/2016 CLINICAL DATA:  Chest wall pain EXAM: CHEST  2 VIEW COMPARISON:  06/05/2008 FINDINGS: There is hyperinflation of the lungs compatible with COPD. Heart and mediastinal contours are within normal limits. No focal opacities or effusions. No acute bony abnormality. IMPRESSION: COPD.  No active disease. Electronically Signed   By: Charlett NoseKevin  Dover M.D.   On: 07/15/2016 13:17   [place] Renal  Result Date: 07/16/2016 CLINICAL DATA:  Acute renal insufficiency EXAM: RENAL ULTRASOUND COMPARISON:  Abdominal ultrasound April 11, 2010 FINDINGS: Right Kidney: Length: 11.2 cm. Echogenicity and renal cortical thickness are within normal limits. No mass, perinephric fluid, or hydronephrosis visualized. No sonographically demonstrable calculus or ureterectasis. Left Kidney: Length: 10.9 cm. Echogenicity and renal cortical thickness are within normal limits. No mass, perinephric fluid, or hydronephrosis visualized. No sonographically demonstrable calculus or ureterectasis. Bladder: Decompressed with Foley catheter and cannot be interrogated. IMPRESSION: No renal lesions evident on this study. Electronically Signed   By: Bretta BangWilliam  Woodruff III M.D.   On: 07/16/2016 11:27   Dg Shoulder Left  Result Date: 07/15/2016 CLINICAL DATA:  Left shoulder pain, status post fall  6 hours ago EXAM: LEFT SHOULDER - 2+ VIEW COMPARISON:  None. FINDINGS: No fracture or dislocation is seen. The joint spaces are preserved. The visualized soft tissues are unremarkable. Visualized left lung is clear. IMPRESSION: Negative. Electronically Signed   By: Charline BillsSriyesh  Krishnan M.D.   On: 07/15/2016 13:16   Dg Hip Unilat With Pelvis 2-3 Views Left  Result Date: 07/15/2016 CLINICAL DATA:  Status post fall, left hip pain EXAM: DG HIP (WITH OR WITHOUT PELVIS) 2-3V LEFT COMPARISON:  None. FINDINGS: There is no evidence of hip fracture or dislocation. There is a nondisplaced fracture of the left superior and inferior pubic rami. There is generalized osteopenia. There is no evidence of arthropathy or other focal bone abnormality. IMPRESSION: No acute osseous injury of the left hip. Nondisplaced fracture of the left superior and inferior pubic rami. Electronically Signed   By: Elige KoHetal  Patel   On: 07/15/2016 12:22     Scheduled Meds: . albuterol  2.5 mg Nebulization Q4H  . arformoterol  15 mcg Nebulization BID  . feeding supplement (ENSURE ENLIVE)  237 mL Oral BID BM  . pravastatin  40 mg Oral QHS  . senna  1 tablet Oral BID  . umeclidinium bromide  1 puff Inhalation Daily   Continuous Infusions:   LOS: 1 day    Time spent: 30 min    Renae FickleSHORT, Railynn Ballo, MD [facility] Pager [phone]  If 7PM-7AM, please contact night-coverage [email or web address] Password [email or web address] 07/16/2016, 2:14 PM

## 2016-07-16 NOTE — Clinical Social Work Note (Addendum)
Clinical Social Work Assessment  Patient Details  Name: Linda Rangel MRN: [id number] Date of Birth: 11-22-1945  Date of referral:  07/16/16               Reason for consult:  Facility Placement                Permission sought to share information with:    Permission granted to share information::     Name::      Linda Rangel  Agency::  SNF  Relationship::  Spouse  Contact Information:   [phone]  Housing/Transportation Living arrangements for the past 2 months:  [place] of Information:  Patient, Spouse Patient Interpreter Needed:  None Criminal Activity/Legal Involvement Pertinent to Current Situation/Hospitalization:  No - Comment as needed Significant Relationships:  Spouse Lives with:    Do you feel safe going back to the place where you live?  Yes Need for family participation in patient care:  Yes (Comment)  Care giving concerns:  PT has recommended to complete rehab before returning to home.    Social Worker assessment / plan:  LCSWA met with spouse at bedside, explained rehab intervention. LCSWA explained SNF faxing out process. Spouse inquired about new facility in [place] but reports he is open to facilities with good rehab.  Plan: Assist with disposition to SNF.Complete fl2/PASRR   Employment status:  Retired Nurse, adult PT Recommendations:  [place] / Referral to community resources:  [place]  Patient/Family's Response to care:  Agreeable  Patient/Family's Understanding of and Emotional Response to Diagnosis, Current Treatment, and Prognosis:  Patient and spouse understands patient needs assistance with ambulating due to fracture pain.   Emotional Assessment Appearance:  Developmentally appropriate Attitude/Demeanor/Rapport:    Affect (typically observed):  Accepting Orientation:  Oriented to Self, Oriented to Place, Disoriented to  Time, Disoriented to  Situation Alcohol / Substance use:  Not Applicable Psych involvement (Current and /or in the community):  No (Comment)  Discharge Needs  Concerns to be addressed:  Discharge Planning Concerns, Care Coordination Readmission within the last 30 days:  No Current discharge risk:  None Barriers to Discharge:  Continued Medical Work up   [employer], LCSW 07/16/2016, 11:23 AM

## 2016-07-16 NOTE — NC FL2 (Signed)
[place] MEDICAID FL2 LEVEL OF CARE SCREENING TOOL     IDENTIFICATION  Patient Name: Linda LangtonScharla B Latendresse Birthdate: 01/24/1946 Sex: female Admission Date (Current Location): 07/15/2016  [facility] [place]   Fac[facility] and Address:  [facility],  [address], [place] [id number]      Provider Number: [id number]  Attending Physician Name and Address:  Renae FickleMackenzie Short, MD  Relative Name and Phone Number:       Current Level of Care: Hospital Recommended Level of Care: Skilled Nursing Facility Prior Approval Number:    Date Approved/Denied:   PASRR Number: [id number]  Discharge Plan: SNF    Current Diagnoses: Patient Active Problem List   Diagnosis Date Noted  . Pubic ramus fracture (HCC) 07/15/2016  . Chronic obstructive pulmonary disease (HCC)   . AKI (acute kidney injury) (HCC)   . Generalized anxiety disorder   . BENIGN PAROXYSMAL POSITIONAL VERTIGO 08/02/2010  . ACUTE MAXILLARY SINUSITIS 08/02/2010  . IRRITABLE BOWEL SYNDROME 05/16/2010  . GASTRITIS 05/01/2010  . DIVERTICULOSIS, COLON 04/05/2010  . COLONIC POLYPS, ADENOMATOUS, HX OF 04/05/2010  . ANAL PRURITUS 09/27/2009  . EPIGASTRIC PAIN 08/17/2009  . EUSTACHIAN TUBE DYSFUNCTION, LEFT 12/28/2008  . COPD 08/05/2008  . HIATAL HERNIA 08/05/2008  . HEMATURIA UNSPECIFIED 08/05/2008  . OSTEOPOROSIS 08/05/2008  . HYPERLIPIDEMIA 07/30/2008  . DEPRESSION 07/30/2008  . ALLERGIC RHINITIS 07/30/2008  . GERD 07/30/2008  . OSTEOARTHRITIS 07/30/2008    Orientation RESPIRATION BLADDER Height & Weight     Self, Place  Normal Continent Weight: 115 lb (52.2 kg) Height:  5\' 3"  (160 cm)  BEHAVIORAL SYMPTOMS/MOOD NEUROLOGICAL BOWEL NUTRITION STATUS      Continent Diet (Heart Healthy)  AMBULATORY STATUS COMMUNICATION OF NEEDS Skin   Extensive Assist Verbally Normal                       Personal Care Assistance Level of Assistance  Bathing, Feeding, Dressing Bathing Assistance:  Limited assistance Feeding assistance: Independent Dressing Assistance: Limited assistance     Functional Limitations Info  Sight, Hearing, Speech Sight Info: Adequate Hearing Info: Adequate Speech Info: Adequate    SPECIAL CARE FACTORS FREQUENCY  PT (By licensed PT), OT (By licensed OT)     PT Frequency: 5 OT Frequency: 5            Contractures Contractures Info: Not present    Additional Factors Info  Code Status, Allergies Code Status Info: Fullcode Allergies Info: : Celecoxib, Codeine, Ibandronate Sodium, Prednisone, Rofecoxib           Current Medications (07/16/2016):  This is the current hospital active medication list Current Facility-Administered Medications  Medication Dose Route Frequency Provider Last Rate Last Dose  . acetaminophen (TYLENOL) tablet 650 mg  650 mg Oral Q6H PRN Lenox PondsEdwin Silva Zapata, MD   650 mg at 07/16/16 0447   Or  . acetaminophen (TYLENOL) suppository 650 mg  650 mg Rectal Q6H PRN Lenox PondsEdwin Silva Zapata, MD      . albuterol (PROVENTIL) (2.5 MG/3ML) 0.083% nebulizer solution 2.5 mg  2.5 mg Nebulization QID Renae FickleMackenzie Short, MD      . albuterol (PROVENTIL) (2.5 MG/3ML) 0.083% nebulizer solution 2.5 mg  2.5 mg Nebulization Q4H PRN Renae FickleMackenzie Short, MD      . arformoterol [facility](BROVANA) nebulizer solution 15 mcg  15 mcg Nebulization BID Lenox PondsEdwin Silva Zapata, MD   15 mcg at 07/16/16 [phone]  . feeding supplement (ENSURE ENLIVE) (ENSURE ENLIVE) liquid 237 mL  237  mL Oral BID BM Renae Fickle, MD      . morphine 2 MG/ML injection 2 mg  2 mg Intravenous Q2H PRN Lenox Ponds, MD   2 mg at 07/16/16 0447  . ondansetron (ZOFRAN) tablet 4 mg  4 mg Oral Q6H PRN Lenox Ponds, MD       Or  . ondansetron [facility]) injection 4 mg  4 mg Intravenous Q6H PRN Lenox Ponds, MD      . pravastatin (PRAVACHOL) tablet 40 mg  40 mg Oral QHS Lenox Ponds, MD   40 mg at 07/15/16 2155  . senna (SENOKOT) tablet 8.6 mg  1 tablet Oral BID Lenox Ponds, MD    8.6 mg at 07/16/16 1021  . traMADol (ULTRAM) tablet 50 mg  50 mg Oral Q12H PRN Lenox Ponds, MD   50 mg at 07/16/16 1021  . umeclidinium bromide (INCRUSE ELLIPTA) 62.5 MCG/INH 1 puff  1 puff Inhalation Daily Lenox Ponds, MD   1 puff at 07/16/16 1610     Discharge Medications: Please see discharge summary for a list of discharge medications.  Relevant Imaging Results:  Relevant Lab Results:   Additional Information ssn:[id number]  Clearance Coots, LCSW

## 2016-07-16 NOTE — Progress Notes (Signed)
Initial Nutrition Assessment  DOCUMENTATION CODES:   Not applicable  INTERVENTION:  Provide Ensure Enlive po BID, each supplement provides 350 kcal and 20 grams of protein.  Encouraged ongoing intake of adequate protein and calories through meals. Recommended small, frequent meals in setting of poor appetite/pain.   NUTRITION DIAGNOSIS:   Predicted suboptimal nutrient intake related to poor appetite, other (see comment) (pain from pubic ramus fracture) as evidenced by per patient/family report.  GOAL:   Patient will meet greater than or equal to 90% of their needs  MONITOR:   PO intake, Supplement acceptance, Labs, Weight trends, I & O's  REASON FOR ASSESSMENT:   Malnutrition Screening Tool    ASSESSMENT:   71 year old female with PMHx of COPD presents after mechanical fall at home, found to have pubic ramus fracture.   Spoke with patient at bedside. Patient reports she is a lot of pain now after her fall so she is eating less than normal. Patient was eating strawberry yogurt at time of assessment. She reports her typical intake is 3 meals per day. For breakfast she has bacon, egg, and grits. For lunch she will have fruit or a bagel with cream cheese. For dinner she has a meat, vegetable, and bread. With poor appetite from pain patient wants to eat lighter meals and is also amenable to drinking some Ensure to help meet needs. Patient denies N/V or abdominal pain, no difficulty with chewing/swallowing.  Last weight in chart is from 2012, but she is weight stable compared to this weight. Patient reports she was 125 lbs approximately 1 year ago and has lost 10 lbs (8% body weight) over 1 year, which is not significant for time frame.  Medications reviewed and include: senna, NS @ 125 ml/hr.  Labs reviewed: Glucose 137, BUN 26, Creatinine 2.62.   Nutrition-Focused physical exam completed. Findings are no fat depletion, mild muscle depletion (noted only on dorsal hand, so will not  consider significant), and no edema.   Diet Order:  Diet Heart Room service appropriate? Yes; Fluid consistency: Thin  Skin:  Reviewed, no issues  Last BM:  07/15/2016  Height:   Ht Readings from Last 1 Encounters:  07/15/16 5\' 3"  (1.6 m)    Weight:   Wt Readings from Last 1 Encounters:  07/15/16 115 lb (52.2 kg)    Ideal Body Weight:  52.3 kg  BMI:  Body mass index is 20.37 kg/m.  Estimated Nutritional Needs:   Kcal:  1215-1315 (MSJ x 1.2-1.3)  Protein:  60-70 grams (1.1-1.3 grams/kg)  Fluid:  1.3 L/day (25 ml/kg)  EDUCATION NEEDS:   No education needs identified at this time  Helane RimaLeanne Kyden Potash, MS, RD, LDN Pager: [phone] After Hours Pager: [phone]

## 2016-07-16 NOTE — Evaluation (Signed)
Physical Therapy Evaluation Patient Details Name: Linda Rangel Limbach MRN: [id number] DOB: [birth year] Today's Date: 07/16/2016   History of Present Illness  HPI: Linda Rangel Eyman is a 71 y.o. female with medical history significant of COPD non O2 dependent presents to the ED after mechanical fall at home, Pelvis x-ray show nondisplaced rupture of the left superior and inferior pubic rami. Orthopedics recommends WBAT  Clinical Impression  The patient  Did tolerated  Mobilizing to stand at the bedside with 2 assist,  Much assistance for bed mobility. Pt admitted with above diagnosis. Pt currently with functional limitations due to the deficits listed below (see PT Problem List).  Pt will benefit from skilled PT to increase their independence and safety with mobility to allow discharge to the venue listed below.        Follow Up Recommendations SNF;Supervision/Assistance - 24 hour    Equipment Recommendations  Rolling walker with 5" wheels    Recommendations for Other Services       Precautions / Restrictions Precautions Precautions: Fall Restrictions Weight Bearing Restrictions: No LLE Weight Bearing: Weight bearing as tolerated      Mobility  Bed Mobility Overal bed mobility: Needs Assistance Bed Mobility: Supine to Sit;Sit to Supine     Supine to sit: Max assist;+2 for physical assistance;+2 for safety/equipment;HOB elevated Sit to supine: Max assist;+2 for physical assistance;+2 for safety/equipment   General bed mobility comments: use of bed pad and bed rails to slide  the patient to sitting, assist with the left leg throughout.   Transfers Overall transfer level: Needs assistance Equipment used: Rolling walker (2 wheeled) Transfers: Sit to/from Stand Sit to Stand: +2 safety/equipment;+2 physical assistance;Max assist         General transfer comment: asist to rise, able to scoot/step sideways along the bed to move higher up.  Onlt=y tolerated partial weight on thee  left.  Ambulation/Gait                Stairs            Wheelchair Mobility    Modified Rankin (Stroke Patients Only)       Balance Overall balance assessment: History of Falls;Needs assistance Sitting-balance support: Feet supported;Bilateral upper extremity supported Sitting balance-Leahy Scale: Fair Sitting balance - Comments: relies on the arms for support and decrease weight through the pelvis                                     Pertinent Vitals/Pain Pain Assessment: 0-10 Pain Score: 5  Pain Location: left  leg up into  the side Pain Descriptors / Indicators: Crushing;Grimacing;Guarding;Sharp;Shooting;Jabbing Pain Intervention(s): Limited activity within patient's tolerance;Premedicated before session;Repositioned    Home Living Family/patient expects to be discharged to:: Private residence Living Arrangements: Spouse/significant other Available Help at Discharge: Family Type of Home: House Home Access: Stairs to enter   Secretary/administratorntrance Stairs-Number of Steps: 1 Home Layout: One level Home Equipment: None      Prior Function Level of Independence: Independent         Comments: drives     Hand Dominance        Extremity/Trunk Assessment   Upper Extremity Assessment Upper Extremity Assessment: Generalized weakness    Lower Extremity Assessment Lower Extremity Assessment: LLE deficits/detail LLE Deficits / Details: requires assistance to move the leg on the bed. did place PWB when standing    Cervical / Trunk Assessment Cervical /  Trunk Assessment: Kyphotic  Communication      Cognition Arousal/Alertness: Awake/alert Behavior During Therapy: WFL for tasks assessed/performed Overall Cognitive Status: Impaired/Different from baseline Area of Impairment: Orientation;Safety/judgement Orientation Level: Time             General Comments: did not know if shwe ate breakfast.    General Comments      Exercises      Assessment/Plan    PT Assessment Patient needs continued PT services  PT Problem List Decreased strength;Decreased range of motion;Decreased activity tolerance;Decreased balance;Decreased mobility;Decreased knowledge of use of DME;Decreased safety awareness;Decreased knowledge of precautions;Pain;Decreased cognition          PT Treatment Interventions DME instruction;Gait training;Functional mobility training;Therapeutic activities;Therapeutic exercise;Patient/family education    PT Goals (Current goals can be found in the Care Plan section)  Acute Rehab PT Goals Patient Stated Goal: to walk PT Goal Formulation: With patient Time For Goal Achievement: 07/30/16 Potential to Achieve Goals: Good    Frequency Min 3X/week   Barriers to discharge Decreased caregiver support spouse works    Co-evaluation               End of Session   Activity Tolerance: Patient limited by pain Patient left: in bed;with call bell/phone within reach;with bed alarm set Nurse Communication: Mobility status         Time: 4098-1191 PT Time Calculation (min) (ACUTE ONLY): 23 min   Charges:   PT Evaluation $PT Eval Low Complexity: 1 Procedure PT Treatments $Therapeutic Activity: 8-22 mins   PT G Codes:        Rada Hay 07/16/2016, 10:15 AM Blanchard Kelch PT [phone]

## 2016-07-17 ENCOUNTER — Inpatient Hospital Stay (HOSPITAL_COMMUNITY): Payer: Medicare Other

## 2016-07-17 DIAGNOSIS — R000 Tachycardia, unspecified: Secondary | ICD-10-CM

## 2016-07-17 DIAGNOSIS — J9601 Acute respiratory failure with hypoxia: Secondary | ICD-10-CM

## 2016-07-17 LAB — CBC
HCT: 32.9 % — ABNORMAL LOW (ref 36.0–46.0)
HEMOGLOBIN: 11 g/dL — AB (ref 12.0–15.0)
MCH: 30.5 pg (ref 26.0–34.0)
MCHC: 33.4 g/dL (ref 30.0–36.0)
MCV: 91.1 fL (ref 78.0–100.0)
Platelets: 123 10*3/uL — ABNORMAL LOW (ref 150–400)
RBC: 3.61 MIL/uL — AB (ref 3.87–5.11)
RDW: 13.3 % (ref 11.5–15.5)
WBC: 6.5 10*3/uL (ref 4.0–10.5)

## 2016-07-17 LAB — URINE CULTURE: CULTURE: NO GROWTH

## 2016-07-17 LAB — BASIC METABOLIC PANEL
ANION GAP: 7 (ref 5–15)
BUN: 10 mg/dL (ref 6–20)
CALCIUM: 8.4 mg/dL — AB (ref 8.9–10.3)
CO2: 25 mmol/L (ref 22–32)
Chloride: 105 mmol/L (ref 101–111)
Creatinine, Ser: 0.52 mg/dL (ref 0.44–1.00)
GFR calc non Af Amer: >60 mL/min (ref >60)
Glucose, Bld: 155 mg/dL — ABNORMAL HIGH (ref 65–99)
Potassium: 3.2 mmol/L — ABNORMAL LOW (ref 3.5–5.1)
Sodium: 137 mmol/L (ref 135–145)

## 2016-07-17 LAB — D-DIMER, QUANTITATIVE (NOT AT ARMC): D DIMER QUANT: 4.35 ug{FEU}/mL — AB (ref 0.00–0.50)

## 2016-07-17 LAB — TSH: TSH: 2.013 u[IU]/mL (ref 0.350–4.500)

## 2016-07-17 MED ORDER — LEVALBUTEROL HCL 1.25 MG/0.5ML IN NEBU
1.2500 mg | INHALATION_SOLUTION | Freq: Two times a day (BID) | RESPIRATORY_TRACT | Status: DC
  Administered 2016-07-17 – 2016-07-19 (×4): 1.25 mg via RESPIRATORY_TRACT
  Filled 2016-07-17 (×4): qty 0.5

## 2016-07-17 MED ORDER — IOPAMIDOL (ISOVUE-370) INJECTION 76%
INTRAVENOUS | Status: AC
  Filled 2016-07-17: qty 100

## 2016-07-17 MED ORDER — HALOPERIDOL LACTATE 5 MG/ML IJ SOLN
2.5000 mg | Freq: Four times a day (QID) | INTRAMUSCULAR | Status: DC | PRN
  Administered 2016-07-18: 2.5 mg via INTRAVENOUS
  Filled 2016-07-17: qty 1

## 2016-07-17 MED ORDER — TRAMADOL HCL 50 MG PO TABS
50.0000 mg | ORAL_TABLET | Freq: Four times a day (QID) | ORAL | Status: DC | PRN
  Administered 2016-07-17 – 2016-07-20 (×3): 50 mg via ORAL
  Filled 2016-07-17 (×2): qty 1

## 2016-07-17 MED ORDER — METHYLPREDNISOLONE SODIUM SUCC 125 MG IJ SOLR
125.0000 mg | Freq: Once | INTRAMUSCULAR | Status: AC
  Administered 2016-07-17: 125 mg via INTRAVENOUS
  Filled 2016-07-17: qty 2

## 2016-07-17 MED ORDER — DEXTROSE 5 % IV SOLN
1.0000 g | INTRAVENOUS | Status: DC
  Administered 2016-07-17 – 2016-07-18 (×2): 1 g via INTRAVENOUS
  Filled 2016-07-17 (×2): qty 10

## 2016-07-17 MED ORDER — ACETAMINOPHEN 325 MG PO TABS
650.0000 mg | ORAL_TABLET | Freq: Three times a day (TID) | ORAL | Status: DC
  Administered 2016-07-17 – 2016-07-20 (×12): 650 mg via ORAL
  Filled 2016-07-17 (×10): qty 2

## 2016-07-17 MED ORDER — FENTANYL CITRATE (PF) 100 MCG/2ML IJ SOLN
50.0000 ug | Freq: Once | INTRAMUSCULAR | Status: AC
  Administered 2016-07-17: 50 ug via INTRAVENOUS
  Filled 2016-07-17: qty 2

## 2016-07-17 MED ORDER — SODIUM CHLORIDE 0.9 % IJ SOLN
INTRAMUSCULAR | Status: AC
  Filled 2016-07-17: qty 50

## 2016-07-17 MED ORDER — IOPAMIDOL (ISOVUE-370) INJECTION 76%
100.0000 mL | Freq: Once | INTRAVENOUS | Status: AC | PRN
  Administered 2016-07-17: 100 mL via INTRAVENOUS

## 2016-07-17 MED ORDER — METHYLPREDNISOLONE SODIUM SUCC 40 MG IJ SOLR
40.0000 mg | Freq: Every day | INTRAMUSCULAR | Status: DC
  Administered 2016-07-18: 40 mg via INTRAVENOUS
  Filled 2016-07-17: qty 1

## 2016-07-17 MED ORDER — LEVALBUTEROL HCL 1.25 MG/0.5ML IN NEBU
1.2500 mg | INHALATION_SOLUTION | Freq: Four times a day (QID) | RESPIRATORY_TRACT | Status: DC
  Filled 2016-07-17: qty 0.5

## 2016-07-17 MED ORDER — POTASSIUM CHLORIDE CRYS ER 20 MEQ PO TBCR
40.0000 meq | EXTENDED_RELEASE_TABLET | Freq: Once | ORAL | Status: AC
  Administered 2016-07-17: 40 meq via ORAL
  Filled 2016-07-17: qty 2

## 2016-07-17 MED ORDER — FUROSEMIDE 10 MG/ML IJ SOLN
20.0000 mg | Freq: Once | INTRAMUSCULAR | Status: AC
  Administered 2016-07-17: 20 mg via INTRAVENOUS
  Filled 2016-07-17: qty 2

## 2016-07-17 MED ORDER — LORAZEPAM 2 MG/ML IJ SOLN
0.5000 mg | Freq: Once | INTRAMUSCULAR | Status: AC
  Administered 2016-07-17: 0.5 mg via INTRAVENOUS
  Filled 2016-07-17: qty 1

## 2016-07-17 NOTE — Progress Notes (Signed)
LCSWA met with spouse at bedside and provided SNF bed offers. LCSWA answered questions and concerns about SNF placement and also educated pt. Spouse about going to visit facilities and calling them to ask questions.   LCSWA will follow up with pt. Spouse in the a.m.  LCSWA will continue to assist with patient disposition.   Kathrin Greathouse, Latanya Presser, MSW Clinical Social Worker 5E and Psychiatric Service Line [phone] 07/17/2016  1:24 PM

## 2016-07-17 NOTE — Progress Notes (Signed)
Pt very confused, restless, and anxious. Mittens placed due to the patient attempting to pull her foley and IVs out. MD notified.

## 2016-07-17 NOTE — Progress Notes (Signed)
Pharmacy Antibiotic Note  Vinnie LangtonScharla B Ambrosino is a 71 y.o. female admitted on 07/15/2016 with UTI.  Pharmacy has been consulted for ceftriaxone dosing.  Plan: Ceftriaxone 1g IV q24h.  Will not require further dose adjustment so pharmacy will sign off at this time. Please re-consult as needed.  Height: 5\' 3"  (160 cm) Weight: 115 lb (52.2 kg) IBW/kg (Calculated) : 52.4  Temp (24hrs), Avg:97.7 F (36.5 C), Min:97.3 F (36.3 C), Max:98.1 F (36.7 C)   Recent Labs Lab 07/15/16 1419 07/16/16 0508 07/16/16 1445 07/17/16 0540  WBC 8.4 11.0* 6.4 6.5  CREATININE 1.51* 2.62* 0.89 0.52    Estimated Creatinine Clearance: 53.2 mL/min (by C-G formula based on SCr of 0.52 mg/dL).    Allergies  Allergen Reactions  . Celecoxib   . Codeine   . Ibandronate Sodium     REACTION: increased arthritic pain  . Prednisone     REACTION: Generalized Swelling  . Rofecoxib     Antimicrobials this admission: 2/13 CTX >>   Microbiology results: 2/12 UCx: NGF    Thank you for allowing pharmacy to be a part of this patient's care.  Clance BollRunyon, Susan Arana 07/17/2016 11:19 AM

## 2016-07-17 NOTE — Progress Notes (Signed)
Student RN checked vital signs- BP elevated, HR elevated and RR elevated.   Rechecked by this RN x 2. Patient reports she had been in pain and was recently repositioned. Patient given pain medicine. MD aware. Will continue to monitor.

## 2016-07-17 NOTE — Progress Notes (Signed)
PROGRESS NOTE  Linda LangtonScharla B Hartshorn  NWG:[id number] DOB: [birth year] DOA: 07/15/2016 PCP: Ninetta LightsFURR,SARA, MD  Brief Narrative:   Linda Rangel is a 71 y.o. female with history of COPD non O2 dependent who presented to the ED after mechanical fall at home.  She reported that she stood up from the sofa and her foot slipped on the hardwood floor. She denied head injury, loss of consciousness or neck pain. Patient had pain in her left leg and left buttock which was severe and pelvis x-ray confirmed a nondisplaced rupture of the left superior and inferior pubic rami.  Dr. Magnus IvanBlackman orthopedic surgery advised pain control and WBAT, but she was unable to tolerate ambulation.  Additionally, she developed AKI.  Foley catheter was placed and she had vibrant hematuria.    Assessment & Plan:   Active Problems:   Pubic ramus fracture (HCC)   Chronic obstructive pulmonary disease (HCC)   AKI (acute kidney injury) (HCC)   Generalized anxiety disorder  Pubic ramus fracture (HCC) - patient not able to ambulate due to severe pain.   -  D/c morphine (caused lasting confusion) -  Ultram prn -  PT assessment appreciated -  SW consult placed for SNF  Acute respiratory failure with hypoxia, suspect this is due to atelectasis in setting of emphysema/COPD -  CT angio negative for PE and pneumonia -  Wean O2 as tolerated -  Schedule xopenex -  Continue brovana and ellipta -  Hold IVF pending CXR   AKI, resolved.  [facility] - up to 2.62 on 2/12.  Back to baseline.  Hematuria after foley placement.  Was dark blood with large clots initially.  Looks clearer today -  Flush foley prn -  If not clearing, consult Urology but looks better today  Anxiety - stable  Patient not taking any medications at this time   Tachycardia, likely due to pain, confusion -  TSH wnl and CT angio negative for PE -  Not on beta blockers at home -  No history of benzo or EtOH abuse -  Does not appear  clinically dehydrated  Hypokalemia -  Oral potassium repletion  Normocytic anemia -  Hemoglobin decreased slightly, likely combination of IVF and hematuria -  Repeat h/h in am  DVT prophylaxis:  SCDs Code Status:  Full code Family Communication:  Patient and her husband at bedside Disposition Plan:  To SNF   Consultants:   Orthopedic surgery, Dr. Magnus IvanBlackman   Procedures:  none  Antimicrobials:  Anti-infectives    Start     Dose/Rate Route Frequency Ordered Stop   07/17/16 1200  cefTRIAXone (ROCEPHIN) 1 g in dextrose 5 % 50 mL IVPB     1 g 100 mL/hr over 30 Minutes Intravenous Every 24 hours 07/17/16 1122         Subjective: Ongoing pain in the left buttock making ambulation difficult.  Denies recent dysuria, urinary frequency or urgency.  No change to the color or volume of her urine recently.  Denies abdominal or flank pain.    Objective: Vitals:   07/17/16 0439 07/17/16 0817 07/17/16 1459 07/17/16 1600  BP: (!) 147/89  (!) 143/80   Pulse: (!) 122  (!) 115 (!) 122  Resp: 19  (!) 40   Temp: 97.8 F (36.6 C)  98.1 F (36.7 C)   TempSrc: Oral  Oral   SpO2: 93% 93% 96%   Weight:      Height:  Intake/Output Summary (Last 24 hours) at 07/17/16 1801 Last data filed at 07/17/16 1700  Gross per 24 hour  Intake             1070 ml  Output             1425 ml  Net             -355 ml   Filed Weights   07/15/16 1132  Weight: 52.2 kg (115 lb)    Examination:  General exam:  Thin adult female, no acute distress.  No acute distress.  Confused.  HEENT:  NCAT, MMM Respiratory system:  Diminished bilateral breath sounds, no focal rales, rhonchi, or wheeze Cardiovascular system: Regular rate and rhythm, normal S1/S2. No murmurs, rubs, gallops or clicks.  Warm extremities Gastrointestinal system: Normal active bowel sounds, soft, nondistended, nontender. MSK:  Normal tone and bulk, no lower extremity edema Neuro:  Grossly moves all extremities Psych:  Does not  remember me from yesterday.  Disorganized thinking and poor [place] term memory.      Data Reviewed: I have personally reviewed following labs and imaging studies  CBC:  Recent Labs Lab 07/15/16 1419 07/16/16 0508 07/16/16 1445 07/17/16 0540  WBC 8.4 11.0* 6.4 6.5  NEUTROABS 6.8  --   --   --   HGB 14.0 13.6 12.2 11.0*  HCT 40.9 40.5 36.3 32.9*  MCV 92.7 93.8 91.7 91.1  PLT 168 167 146* 123*   Basic Metabolic Panel:  Recent Labs Lab 07/15/16 1419 07/16/16 0508 07/16/16 1445 07/17/16 0540  NA 137 137 136 137  K 4.0 4.3 3.5 3.2*  CL 102 103 106 105  CO2 26 23 23 25   GLUCOSE 117* 137* 153* 155*  BUN 15 26* 17 10  CREATININE 1.51* 2.62* 0.89 0.52  CALCIUM 9.0 8.8* 7.9* 8.4*   GFR: Estimated Creatinine Clearance: 53.2 mL/min (by C-G formula based on SCr of 0.52 mg/dL). Liver Function Tests: No results for input(s): AST, ALT, ALKPHOS, BILITOT, PROT, ALBUMIN in the last 168 hours. No results for input(s): LIPASE, AMYLASE in the last 168 hours. No results for input(s): AMMONIA in the last 168 hours. Coagulation Profile:  Recent Labs Lab 07/16/16 1445  INR 1.06   Cardiac Enzymes: No results for input(s): CKTOTAL, CKMB, CKMBINDEX, TROPONINI in the last 168 hours. BNP (last 3 results) No results for input(s): PROBNP in the last 8760 hours. HbA1C: No results for input(s): HGBA1C in the last 72 hours. CBG: No results for input(s): GLUCAP in the last 168 hours. Lipid Profile: No results for input(s): CHOL, HDL, LDLCALC, TRIG, CHOLHDL, LDLDIRECT in the last 72 hours. Thyroid Function Tests:  Recent Labs  07/17/16 1221  TSH 2.013   Anemia Panel: No results for input(s): VITAMINB12, FOLATE, FERRITIN, TIBC, IRON, RETICCTPCT in the last 72 hours. Urine analysis:    Component Value Date/Time   COLORURINE RED (A) 07/16/2016 1708   APPEARANCEUR CLOUDY (A) 07/16/2016 1708   LABSPEC 1.024 07/16/2016 1708   PHURINE 5.0 07/16/2016 1708   GLUCOSEU NEGATIVE 07/16/2016  1708   HGBUR LARGE (A) 07/16/2016 1708   BILIRUBINUR NEGATIVE 07/16/2016 1708   KETONESUR NEGATIVE 07/16/2016 1708   PROTEINUR 100 (A) 07/16/2016 1708   UROBILINOGEN 0.2 10/26/2008 1409   NITRITE NEGATIVE 07/16/2016 1708   LEUKOCYTESUR SMALL (A) 07/16/2016 1708   Sepsis Labs: @LABRCNTIP (procalcitonin:4,lacticidven:4)  ) Recent Results (from the past 240 hour(s))  Culture, Urine     Status: None   Collection Time: 07/16/16  6:30 AM  Result Value Ref Range Status   Specimen Description URINE, CLEAN CATCH  Final   Special Requests NONE  Final   Culture   Final    NO GROWTH Performed at [facility], [address], [place], [place] [address]    Report Status 07/17/2016 FINAL  Final      Radiology Studies: Ct Angio Chest Pe W Or Wo Contrast  Result Date: 07/17/2016 CLINICAL DATA:  COPD, dyspnea EXAM: CT ANGIOGRAPHY CHEST WITH CONTRAST TECHNIQUE: Multidetector CT imaging of the chest was performed using the standard protocol during bolus administration of intravenous contrast. Multiplanar CT image reconstructions and MIPs were obtained to evaluate the vascular anatomy. CONTRAST:  Pain 100 cc Omnipaque COMPARISON:  CXR 07/16/2016 FINDINGS: Cardiovascular: Normal branch pattern for the great vessels. No pulmonary embolus, aortic dissection or aneurysm. Normal size cardiac chambers with coronary arteriosclerosis. No pericardial effusion. Mediastinum/Nodes: No adenopathy. Normal esophagus, trachea and mainstem bronchi. No thyromegaly. 4 mm cystic nodule in the right thyroid gland. Lungs/Pleura: Centrilobular emphysema bilaterally with subpleural areas of mild interstitial fibrosis. Small bilateral pleural effusions with adjacent atelectasis. No pulmonic consolidation, pneumothorax nor dominant mass. Upper Abdomen: Colonic interposition over the liver. Normal bilateral adrenal glands. No space-occupying mass of the visualized liver. There is no splenomegaly. The visualized kidneys and  pancreas are grossly unremarkable. Musculoskeletal: Degenerative change along the dorsal spine. Chronic appearing chronic appearing lower thoracic compression fractures from T10 through T12 and also T7. Review of the MIP images confirms the above findings. IMPRESSION: No acute pulmonary embolus. Coronary arteriosclerosis. COPD without pulmonic consolidation. Trace bilateral pleural effusions right slightly greater than left. Chronic appearing T7 and T10 through T12 compression with degenerative change along the dorsal spine. Electronically Signed   By: Tollie Eth M.D.   On: 07/17/2016 17:43   US Renal  Result Date: 07/16/2016 CLINICAL DATA:  Acute renal insufficiency EXAM: RENAL ULTRASOUND COMPARISON:  Abdominal ultrasound April 11, 2010 FINDINGS: Right Kidney: Length: 11.2 cm. Echogenicity and renal cortical thickness are within normal limits. No mass, perinephric fluid, or hydronephrosis visualized. No sonographically demonstrable calculus or ureterectasis. Left Kidney: Length: 10.9 cm. Echogenicity and renal cortical thickness are within normal limits. No mass, perinephric fluid, or hydronephrosis visualized. No sonographically demonstrable calculus or ureterectasis. Bladder: Decompressed with Foley catheter and cannot be interrogated. IMPRESSION: No renal lesions evident on this study. Electronically Signed   By: Bretta Bang III M.D.   On: 07/16/2016 11:27   Dg Chest Port 1 View  Result Date: 07/16/2016 CLINICAL DATA:  71 year old with current history of COPD, presenting with acute shortness of breath and hypoxia. EXAM: PORTABLE CHEST 1 VIEW COMPARISON:  07/15/2016, with 04/05/2009 and earlier. FINDINGS: Suboptimal inspiration accounts for crowded bronchovascular markings diffusely and atelectasis in the bases, and accentuates the cardiac silhouette. Taking this into account, cardiac silhouette normal in size and unchanged. Bullous emphysematous changes in the upper lobes, prominent  bronchovascular markings diffusely and mild to moderate central peribronchial thickening, unchanged. Postsurgical changes involving the left lung. Lungs otherwise clear. IMPRESSION: 1. Suboptimal inspiration accounts for mild bibasilar atelectasis. 2. Stable moderate changes of chronic bronchitis and/or asthma. No acute cardiopulmonary disease otherwise. Electronically Signed   By: Hulan Saas M.D.   On: 07/16/2016 14:20     Scheduled Meds: . acetaminophen  650 mg Oral TID  . arformoterol  15 mcg Nebulization BID  . cefTRIAXone (ROCEPHIN)  IV  1 g Intravenous Q24H  . feeding supplement (ENSURE ENLIVE)  237 mL Oral BID BM  .  iopamidol      . levalbuterol  1.25 mg Nebulization BID  . pravastatin  40 mg Oral QHS  . senna  1 tablet Oral BID  . sodium chloride      . umeclidinium bromide  1 puff Inhalation Daily   Continuous Infusions:   LOS: 2 days    Time spent: 30 min    Renae Fickle, MD [facility] Pager [phone]  If 7PM-7AM, please contact night-coverage [email or web address] Password [email or web address] 07/17/2016, 6:01 PM

## 2016-07-18 DIAGNOSIS — J9601 Acute respiratory failure with hypoxia: Secondary | ICD-10-CM

## 2016-07-18 DIAGNOSIS — N179 Acute kidney failure, unspecified: Secondary | ICD-10-CM

## 2016-07-18 DIAGNOSIS — R410 Disorientation, unspecified: Secondary | ICD-10-CM

## 2016-07-18 DIAGNOSIS — J439 Emphysema, unspecified: Secondary | ICD-10-CM

## 2016-07-18 DIAGNOSIS — R310 Gross hematuria: Secondary | ICD-10-CM

## 2016-07-18 DIAGNOSIS — R000 Tachycardia, unspecified: Secondary | ICD-10-CM

## 2016-07-18 DIAGNOSIS — F411 Generalized anxiety disorder: Secondary | ICD-10-CM

## 2016-07-18 DIAGNOSIS — S32592A Other specified fracture of left pubis, initial encounter for closed fracture: Principal | ICD-10-CM

## 2016-07-18 LAB — BASIC METABOLIC PANEL
Anion gap: 8 (ref 5–15)
BUN: 16 mg/dL (ref 6–20)
CALCIUM: 9.2 mg/dL (ref 8.9–10.3)
CO2: 24 mmol/L (ref 22–32)
CREATININE: 1.32 mg/dL — AB (ref 0.44–1.00)
Chloride: 105 mmol/L (ref 101–111)
GFR, EST AFRICAN AMERICAN: 46 mL/min — AB (ref >60)
GFR, EST NON AFRICAN AMERICAN: 40 mL/min — AB (ref >60)
Glucose, Bld: 214 mg/dL — ABNORMAL HIGH (ref 65–99)
Potassium: 3.8 mmol/L (ref 3.5–5.1)
Sodium: 137 mmol/L (ref 135–145)

## 2016-07-18 LAB — GLUCOSE, CAPILLARY
GLUCOSE-CAPILLARY: 223 mg/dL — AB (ref 65–99)
Glucose-Capillary: 195 mg/dL — ABNORMAL HIGH (ref 65–99)

## 2016-07-18 LAB — CBC
HCT: 33.2 % — ABNORMAL LOW (ref 36.0–46.0)
Hemoglobin: 11.3 g/dL — ABNORMAL LOW (ref 12.0–15.0)
MCH: 31.5 pg (ref 26.0–34.0)
MCHC: 34 g/dL (ref 30.0–36.0)
MCV: 92.5 fL (ref 78.0–100.0)
PLATELETS: 158 10*3/uL (ref 150–400)
RBC: 3.59 MIL/uL — ABNORMAL LOW (ref 3.87–5.11)
RDW: 13.6 % (ref 11.5–15.5)
WBC: 6.4 10*3/uL (ref 4.0–10.5)

## 2016-07-18 MED ORDER — SODIUM CHLORIDE 0.9 % IV SOLN
INTRAVENOUS | Status: DC
  Administered 2016-07-18 – 2016-07-19 (×2): via INTRAVENOUS

## 2016-07-18 NOTE — Progress Notes (Signed)
Physical Therapy Treatment Patient Details Name: Linda Rangel MRN: [id number] DOB: 05-Apr-1946 Today's Date: 07/18/2016    History of Present Illness HPI: Linda Rangel is a 71 y.o. female with medical history significant of COPD non O2 dependent presents to the ED after mechanical fall at home, Pelvis x-ray show nondisplaced rupture of the left superior and inferior pubic rami. Orthopedics recommends WBAT    PT Comments    Pt in bed on 1 lt O2 at 94% but HR at rest was 130.  Pt c/o "alot" L side hip pain and need to use "the bathroom".  Noted increased anxiety as pt stated "I've been waiting a while for someone to help me".  Assisted pt OOB to [facility] + 2 assist for safety.  Noted O2 sats decreased to 84% on trial RA so reapplied 2 lts to achieve 90%.  HR remained high 135.  Reported to RN.  Assisted off BSC then a few steps to recliner.  HR increased to 148.  Reported to NT pt was OOB in recliner and that she had a BM in [facility] which was placed in bathroom.    Follow Up Recommendations  SNF     Equipment Recommendations       Recommendations for Other Services       Precautions / Restrictions Precautions Precautions: Fall Restrictions Weight Bearing Restrictions: No LLE Weight Bearing: Weight bearing as tolerated    Mobility  Bed Mobility Overal bed mobility: Needs Assistance Bed Mobility: Supine to Sit     Supine to sit: Max assist;+2 for physical assistance;+2 for safety/equipment;HOB elevated     General bed mobility comments: use of bed pad and bed rails to slide  the patient to sitting, assist with the left leg throughout.   Transfers Overall transfer level: Needs assistance Equipment used: None Transfers: Sit to/from [employer] Sit to Stand: +2 safety/equipment;+2 physical assistance;Max assist Stand pivot transfers: +2 physical assistance;+2 safety/equipment;Max assist       General transfer comment: assisted from elevated bed to [facility] + 2  assist with difficulty tolerating WBing thru l LE and difficulty completing 1/4 turn.    Ambulation/Gait Ambulation/Gait assistance: Max assist;+2 physical assistance;+2 safety/equipment Ambulation Distance (Feet): 2 Feet Assistive device: Rolling walker (2 wheeled) Gait Pattern/deviations: Step-to pattern;Decreased step length - left;Decreased step length - right;Decreased stance time - left;Trunk flexed;Narrow base of support Gait velocity: decreased   General Gait Details: great difficulty advancing either LE and unable to tolerate much weight thru L LE.  Pt was onle able to take a few side steps from [facility] to recliner.  HR 148 and high respiration.    Stairs            Wheelchair Mobility    Modified Rankin (Stroke Patients Only)       Balance                                    Cognition Arousal/Alertness: Awake/alert Behavior During Therapy: WFL for tasks assessed/performed Overall Cognitive Status: Impaired/Different from baseline Area of Impairment: Orientation;Safety/judgement Orientation Level: Time             General Comments: required repeat functional VC's    Exercises      General Comments        Pertinent Vitals/Pain Pain Assessment: Faces Faces Pain Scale: Hurts even more Pain Location: left side and hip Pain Descriptors / Indicators: Crushing;Grimacing;Guarding;Sharp;Shooting;Jabbing Pain  Intervention(s): Monitored during session;Repositioned    Home Living                      Prior Function            PT Goals (current goals can now be found in the care plan section) Progress towards PT goals: Progressing toward goals    Frequency    Min 3X/week      PT Plan Current plan remains appropriate    Co-evaluation             End of Session Equipment Utilized During Treatment: Gait belt Activity Tolerance: Patient limited by fatigue;Patient limited by pain;Other (comment) (HR) Patient left: in  chair;with call bell/phone within reach;with family/visitor present     Time: [id number] PT Time Calculation (min) (ACUTE ONLY): 26 min  Charges:  $Gait Training: 8-22 mins $Therapeutic Activity: 8-22 mins                    G Codes:      Linda ShellingLori Jonty Rangel  PTA [facility]  Acute  Rehab Pager      [phone]

## 2016-07-18 NOTE — Progress Notes (Signed)
PROGRESS NOTE    Linda Rangel  BJY:[id number] DOB: May 03, 1946 DOA: 07/15/2016 PCP: Ninetta Lights, MD   Brief Narrative:  Linda Rangel a 71 y.o.femalewith history of COPD non O2 dependentwho presented to the ED after mechanical fall at home.  She reported that she stood up from the sofa and her foot slipped on the hardwood floor. She denied head injury, loss of consciousness or neck pain. Patient had pain in her left leg and left buttock which was severe andpelvis x-ray confirmed a nondisplaced rupture of the left superior and inferior pubic rami.  Dr. Magnus Ivan orthopedic surgery advised pain control and Weightbearing as Toelrated, but she was unable to tolerate ambulation.  Additionally, she developed AKI.  Foley catheter was placed and she had vibrant hematuria. Hematuria is resolving per nurse. Patient's Confusion/Delirium is much improved.     Assessment & Plan:   Active Problems:   Hematuria   Pubic ramus fracture (HCC)   Chronic obstructive pulmonary disease (HCC)   AKI (acute kidney injury) (HCC)   Generalized anxiety disorder   Acute respiratory failure with hypoxia (HCC)   Sinus tachycardia  Non-Displaced Superior and Inferior Left Pubic ramus fracture (HCC) s/p Mechanical Fall - Patient not able to ambulate due to severe pain.   -  D/c'd  morphine (caused lasting confusion) -  Utram 50 mg po q6hprn -  PT assessment appreciated and Recommend SNF and Rolling walker with 5" wheels -  SW consult placed for SNF  Acute respiratory failure with hypoxia, suspect this is due to atelectasis in setting of emphysema/COPD - Not on Home O2  -  CT angio negative for PE and pneumonia; D-Dimer was elevated at 4.35 -  Wean O2 as tolerated -  Scheduled Xopenex 1.25 mg Neb BID and  -  Continue brovana 15 mcg BID and ellipta 1 puff IH Daily - Continue Methylprednisolone 40 mg IV Daily -  CXR on 07/16/16 showed Suboptimal inspiration accounts for mild bibasilar atelectasis. Stable  moderate changes of chronic bronchitis and/or asthma. No acute cardiopulmonary disease otherwise - Repeat CXR in AM  Acute Delirium, resolved -Likely Medication Induced from Morphine -Pain control with Ultram -D/C'd IV Ceftriaxone as Urine Cx did not grow anything  -Haldol 2.5 mg IV q6hprn for Agitation  AKI   -Baseline Cr 0.59 on 05/31/16 at [facility] -  -up to 2.62 on 2/12. Starting to Increase again and Cr went from 0.52 -> 1.32 - Was given 1 dose of IV 20 mg Lasix yesterday - Will restart Gentle IVF with NS at 75 mL/hr - Repeat CMP in AM  Hematuria (likely Traumatic) after foley placement.   - Was dark blood with large clots initially. Urine was emptied this AM and none was in bag - Flush foley prn - Start IVF again at 75 mL/hr - May need Bladder Irrigation and Urology Consult if not improving - Continue to Monitor and Repeat CBC in AM  Anxiety - stable  - Patient not taking any medications at this time  - Was given 0.5 mg IV yesterday  Tachycardia, likely due to pain; Confusion is resolving  -  TSH wnl and CT angio negative for PE -  Not on beta blockers at home -  No history of benzo or EtOH abuse -  Does not appear clinically dehydrated - Worsened today when patient was up with Pt; Possibly from Xopenx Breathing Tx as well - Continue to Monitor   Hypokalemia, improved - K+ went from 3.2 -> 3.8 -  Oral potassium repletion - Repeat CMP in AM  Normocytic anemia -  Hemoglobin decreased slightly, likely combination of IVF and hematuria -  Repeat Hb/Hct this AM was stable at 11.3/33.2 - Continue to Monitor and Repeat CBC in AM  DVT prophylaxis: SCDs Code Status: FULL CODE Family Communication: No family at bedside Disposition Plan: SNF when Stable; Likely in AM  Consultants:   Orthopedic Surgery Dr. Magnus Ivan   Procedures: None   Antimicrobials:  Anti-infectives    Start     Dose/Rate Route Frequency Ordered Stop   07/17/16 1200  cefTRIAXone (ROCEPHIN) 1 g  in dextrose 5 % 50 mL IVPB     1 g 100 mL/hr over 30 Minutes Intravenous Every 24 hours 07/17/16 1122       Subjective: Seen and examined at bedside and was doing better today. Not as confused. Stated she was only in pain when she tried to move. States SOB is a little better but does not wear O2 at home. No other complaints or concerns at this time and thinks urine is returning to normal color.   Objective: Vitals:   07/17/16 2047 07/17/16 2132 07/18/16 0542 07/18/16 0759  BP: 130/61  123/82   Pulse: (!) 114  (!) 117 68  Resp: (!) 22 20 20 18   Temp: 98.5 F (36.9 C)  98.4 F (36.9 C)   TempSrc: Oral  Oral   SpO2: 94%  100% 95%  Weight:      Height:        Intake/Output Summary (Last 24 hours) at 07/18/16 0816 Last data filed at 07/18/16 0543  Gross per 24 hour  Intake              245 ml  Output             1425 ml  Net            -1180 ml   Filed Weights   07/15/16 1132  Weight: 52.2 kg (115 lb)   Examination: Physical Exam:  Constitutional: Thin Caucasian female in NAD appears calm and not confused. Unable to recall what happened.  ENMT: External Ears, Nose appear normal. Grossly normal hearing.  Neck: Appears normal, supple, no cervical masses, normal ROM, no appreciable thyromegaly, no JVD Respiratory: Diminished to auscultation bilaterally, but no no wheezing, rales, rhonchi or crackles. Normal respiratory effort and patient is not tachypenic. No accessory muscle use. Wearing Supplemental O2.  Cardiovascular: RRR, no murmurs / rubs / gallops. S1 and S2 auscultated.  Abdomen: Soft, non-tender, non-distended. No masses palpated. No appreciable hepatosplenomegaly. Bowel sounds positive x4.  GU: Deferred. Musculoskeletal: No clubbing / cyanosis of digits/nails. No joint deformity upper and lower extremities.  Skin: No rashes, lesions, ulcers n limited skin eval. No induration; Warm and dry.  Neurologic: CN 2-12 grossly intact with no focal deficits. Sensation intact in  all 4 Extremities, Romberg sign cerebellar reflexes not assessed.  Psychiatric: Normal judgment and insight. Alert and oriented x 2. Normal mood and appropriate affect.   Data Reviewed: I have personally reviewed following labs and imaging studies  CBC:  Recent Labs Lab 07/15/16 1419 07/16/16 0508 07/16/16 1445 07/17/16 0540 07/18/16 0531  WBC 8.4 11.0* 6.4 6.5 6.4  NEUTROABS 6.8  --   --   --   --   HGB 14.0 13.6 12.2 11.0* 11.3*  HCT 40.9 40.5 36.3 32.9* 33.2*  MCV 92.7 93.8 91.7 91.1 92.5  PLT 168 167 146* 123* 158   Basic Metabolic Panel:  Recent Labs Lab 07/15/16 1419 07/16/16 0508 07/16/16 1445 07/17/16 0540 07/18/16 0531  NA 137 137 136 137 137  K 4.0 4.3 3.5 3.2* 3.8  CL 102 103 106 105 105  CO2 26 23 23 25 24   GLUCOSE 117* 137* 153* 155* 214*  BUN 15 26* 17 10 16   CREATININE 1.51* 2.62* 0.89 0.52 1.32*  CALCIUM 9.0 8.8* 7.9* 8.4* 9.2   GFR: Estimated Creatinine Clearance: 32.2 mL/min (by C-G formula based on SCr of 1.32 mg/dL (H)). Liver Function Tests: No results for input(s): AST, ALT, ALKPHOS, BILITOT, PROT, ALBUMIN in the last 168 hours. No results for input(s): LIPASE, AMYLASE in the last 168 hours. No results for input(s): AMMONIA in the last 168 hours. Coagulation Profile:  Recent Labs Lab 07/16/16 1445  INR 1.06   Cardiac Enzymes: No results for input(s): CKTOTAL, CKMB, CKMBINDEX, TROPONINI in the last 168 hours. BNP (last 3 results) No results for input(s): PROBNP in the last 8760 hours. HbA1C: No results for input(s): HGBA1C in the last 72 hours. CBG:  Recent Labs Lab 07/18/16 0745  GLUCAP 195*   Lipid Profile: No results for input(s): CHOL, HDL, LDLCALC, TRIG, CHOLHDL, LDLDIRECT in the last 72 hours. Thyroid Function Tests:  Recent Labs  07/17/16 1221  TSH 2.013   Anemia Panel: No results for input(s): VITAMINB12, FOLATE, FERRITIN, TIBC, IRON, RETICCTPCT in the last 72 hours. Sepsis Labs: No results for input(s):  PROCALCITON, LATICACIDVEN in the last 168 hours.  Recent Results (from the past 240 hour(s))  Culture, Urine     Status: None   Collection Time: 07/16/16  6:30 AM  Result Value Ref Range Status   Specimen Description URINE, CLEAN CATCH  Final   Special Requests NONE  Final   Culture   Final    NO GROWTH Performed at [facility], [address], [place], [place] [address]    Report Status 07/17/2016 FINAL  Final    Radiology Studies: Ct Angio Chest Pe W Or Wo Contrast  Result Date: 07/17/2016 CLINICAL DATA:  COPD, dyspnea EXAM: CT ANGIOGRAPHY CHEST WITH CONTRAST TECHNIQUE: Multidetector CT imaging of the chest was performed using the standard protocol during bolus administration of intravenous contrast. Multiplanar CT image reconstructions and MIPs were obtained to evaluate the vascular anatomy. CONTRAST:  Pain 100 cc Omnipaque COMPARISON:  CXR 07/16/2016 FINDINGS: Cardiovascular: Normal branch pattern for the great vessels. No pulmonary embolus, aortic dissection or aneurysm. Normal size cardiac chambers with coronary arteriosclerosis. No pericardial effusion. Mediastinum/Nodes: No adenopathy. Normal esophagus, trachea and mainstem bronchi. No thyromegaly. 4 mm cystic nodule in the right thyroid gland. Lungs/Pleura: Centrilobular emphysema bilaterally with subpleural areas of mild interstitial fibrosis. Small bilateral pleural effusions with adjacent atelectasis. No pulmonic consolidation, pneumothorax nor dominant mass. Upper Abdomen: Colonic interposition over the liver. Normal bilateral adrenal glands. No space-occupying mass of the visualized liver. There is no splenomegaly. The visualized kidneys and pancreas are grossly unremarkable. Musculoskeletal: Degenerative change along the dorsal spine. Chronic appearing chronic appearing lower thoracic compression fractures from T10 through T12 and also T7. Review of the MIP images confirms the above findings. IMPRESSION: No acute pulmonary  embolus. Coronary arteriosclerosis. COPD without pulmonic consolidation. Trace bilateral pleural effusions right slightly greater than left. Chronic appearing T7 and T10 through T12 compression with degenerative change along the dorsal spine. Electronically Signed   By: Tollie Eth M.D.   On: 07/17/2016 17:43   US Renal  Result Date: 07/16/2016 CLINICAL DATA:  Acute renal insufficiency EXAM:  RENAL ULTRASOUND COMPARISON:  Abdominal ultrasound April 11, 2010 FINDINGS: Right Kidney: Length: 11.2 cm. Echogenicity and renal cortical thickness are within normal limits. No mass, perinephric fluid, or hydronephrosis visualized. No sonographically demonstrable calculus or ureterectasis. Left Kidney: Length: 10.9 cm. Echogenicity and renal cortical thickness are within normal limits. No mass, perinephric fluid, or hydronephrosis visualized. No sonographically demonstrable calculus or ureterectasis. Bladder: Decompressed with Foley catheter and cannot be interrogated. IMPRESSION: No renal lesions evident on this study. Electronically Signed   By: Bretta BangWilliam  Woodruff III M.D.   On: 07/16/2016 11:27   Dg Chest Port 1 View  Result Date: 07/16/2016 CLINICAL DATA:  71 year old with current history of COPD, presenting with acute shortness of breath and hypoxia. EXAM: PORTABLE CHEST 1 VIEW COMPARISON:  07/15/2016, with 04/05/2009 and earlier. FINDINGS: Suboptimal inspiration accounts for crowded bronchovascular markings diffusely and atelectasis in the bases, and accentuates the cardiac silhouette. Taking this into account, cardiac silhouette normal in size and unchanged. Bullous emphysematous changes in the upper lobes, prominent bronchovascular markings diffusely and mild to moderate central peribronchial thickening, unchanged. Postsurgical changes involving the left lung. Lungs otherwise clear. IMPRESSION: 1. Suboptimal inspiration accounts for mild bibasilar atelectasis. 2. Stable moderate changes of chronic bronchitis  and/or asthma. No acute cardiopulmonary disease otherwise. Electronically Signed   By: Hulan Saashomas  Lawrence M.D.   On: 07/16/2016 14:20   Scheduled Meds: . acetaminophen  650 mg Oral TID  . arformoterol  15 mcg Nebulization BID  . cefTRIAXone (ROCEPHIN)  IV  1 g Intravenous Q24H  . feeding supplement (ENSURE ENLIVE)  237 mL Oral BID BM  . levalbuterol  1.25 mg Nebulization BID  . methylPREDNISolone (SOLU-MEDROL) injection  40 mg Intravenous Daily  . pravastatin  40 mg Oral QHS  . senna  1 tablet Oral BID  . umeclidinium bromide  1 puff Inhalation Daily   Continuous Infusions:   LOS: 3 days   Merlene Laughtermair Latif Tessia Kassin, DO [facility] Pager [phone]  If 7PM-7AM, please contact night-coverage [email or web address] Password [email or web address] 07/18/2016, 8:16 AM

## 2016-07-19 LAB — BASIC METABOLIC PANEL
Anion gap: 8 (ref 5–15)
BUN: 14 mg/dL (ref 6–20)
CALCIUM: 9.4 mg/dL (ref 8.9–10.3)
CHLORIDE: 109 mmol/L (ref 101–111)
CO2: 23 mmol/L (ref 22–32)
CREATININE: 0.59 mg/dL (ref 0.44–1.00)
GFR calc non Af Amer: >60 mL/min (ref >60)
Glucose, Bld: 121 mg/dL — ABNORMAL HIGH (ref 65–99)
Potassium: 3.7 mmol/L (ref 3.5–5.1)
SODIUM: 140 mmol/L (ref 135–145)

## 2016-07-19 LAB — PHOSPHORUS: Phosphorus: 1.8 mg/dL — ABNORMAL LOW (ref 2.5–4.6)

## 2016-07-19 LAB — CBC
HCT: 33.5 % — ABNORMAL LOW (ref 36.0–46.0)
Hemoglobin: 11.6 g/dL — ABNORMAL LOW (ref 12.0–15.0)
MCH: 31.8 pg (ref 26.0–34.0)
MCHC: 34.6 g/dL (ref 30.0–36.0)
MCV: 91.8 fL (ref 78.0–100.0)
Platelets: 233 10*3/uL (ref 150–400)
RBC: 3.65 MIL/uL — AB (ref 3.87–5.11)
RDW: 13.9 % (ref 11.5–15.5)
WBC: 11.4 10*3/uL — AB (ref 4.0–10.5)

## 2016-07-19 LAB — MAGNESIUM: MAGNESIUM: 2.1 mg/dL (ref 1.7–2.4)

## 2016-07-19 MED ORDER — LEVALBUTEROL HCL 0.63 MG/3ML IN NEBU
0.6300 mg | INHALATION_SOLUTION | Freq: Two times a day (BID) | RESPIRATORY_TRACT | Status: DC
  Administered 2016-07-19 – 2016-07-20 (×2): 0.63 mg via RESPIRATORY_TRACT
  Filled 2016-07-19 (×2): qty 3

## 2016-07-19 MED ORDER — DIPHENHYDRAMINE HCL 50 MG PO CAPS
50.0000 mg | ORAL_CAPSULE | Freq: Every evening | ORAL | Status: DC | PRN
Start: 2016-07-19 — End: 2016-07-20
  Administered 2016-07-19: 50 mg via ORAL
  Filled 2016-07-19: qty 1

## 2016-07-19 MED ORDER — HYDROXYZINE HCL 10 MG PO TABS
10.0000 mg | ORAL_TABLET | Freq: Three times a day (TID) | ORAL | Status: DC | PRN
  Administered 2016-07-19 (×2): 10 mg via ORAL
  Filled 2016-07-19 (×3): qty 1

## 2016-07-19 NOTE — Progress Notes (Signed)
PROGRESS NOTE    Linda Rangel  ZOX:[id number] DOB: 20-Aug-1945 DOA: 07/15/2016 PCP: Ninetta LightsFURR,SARA, MD   Brief Narrative:  Linda Rangel a 71 y.o.femalewith history of COPD non O2 dependentwho presented to the ED after mechanical fall at home.  She reported that she stood up from the sofa and her foot slipped on the hardwood floor. She denied head injury, loss of consciousness or neck pain. Patient had pain in her left leg and left buttock which was severe andpelvis x-ray confirmed a nondisplaced rupture of the left superior and inferior pubic rami.  Dr. Magnus IvanBlackman orthopedic surgery advised pain control and Weightbearing as Toelrated, but she was unable to tolerate ambulation.  Additionally, she developed AKI.  Foley catheter was placed and she had vibrant hematuria. Hematuria is resolving per nurse. Patient's Confusion/Delirium was worse today.   Assessment & Plan:   Active Problems:   Hematuria   Pubic ramus fracture (HCC)   Chronic obstructive pulmonary disease (HCC)   AKI (acute kidney injury) (HCC)   Generalized anxiety disorder   Acute respiratory failure with hypoxia (HCC)   Sinus tachycardia  Non-Displaced Superior and Inferior Left Pubic ramus fracture (HCC) s/p Mechanical Fall - Patient not able to ambulate due to severe pain.   -  D/c'd  morphine (caused lasting confusion) -  Utram 50 mg po q6hprn and Tylenol 650 mg po TID scheduled  -  PT assessment appreciated and Recommend SNF and Rolling walker with 5" wheels -  SW consult placed for SNF  Acute respiratory failure with hypoxia, suspect this is due to atelectasis in setting of emphysema/COPD - Not on Home O2  -  CT angio negative for PE and pneumonia; D-Dimer was elevated at 4.35 -  Wean O2 as tolerated -  Scheduled Xopenex 1.25 mg Neb BID changed to 0.63 BID -  Continue brovana 15 mcg BID and ellipta 1 puff IH Daily - D/C'd Methylprednisolone 40 mg IV Daily because of possible steroid confusion -  CXR on  07/16/16 showed Suboptimal inspiration accounts for mild bibasilar atelectasis. Stable moderate changes of chronic bronchitis and/or asthma. No acute cardiopulmonary disease otherwise - Repeat CXR in AM  Acute Delirium,  -Likely Medication Induced from Morphine -Pain control with Ultram and Tylenol -D/C'd IV Ceftriaxone as Urine Cx did not grow anything  -Haldol 2.5 mg IV q6hprn for Agitation -Given Atarax 10 mg TIDprn for Agiation -Benadryl 50 mg qHS prn for Sleep -Delirium Precautions -Discontinued IV Steroids  Leukocytosis -WBC went from 6.4 -> 11.4 (was 11.0 on 2/12) -Could be related to pain vs. IV Steroid Use -No active S/Sx of Infection and patient Afebrile -Continue to Monitor and repeat CBC in AM  AKI   - [facility] -  - up to 2.62 on 2/12. Improved to 0.59 now - Improved after gentle IVF with NS at 75 mL/hr - Repeat CMP in AM  Hematuria (likely Traumatic) after foley placement.   - Was dark blood with large clots initially. Urine slightly pink this AM - Flush foley prn - D/C'd IVF again at 75 mL/hr - May need Bladder Irrigation and Urology Consult if not improving - Continue to Monitor and Repeat CBC in AM  Anxiety  - Patient not taking any Home medications at this time  - Was given 0.5 mg IV Ativan previously - Will start Atarax for Anxiety; Home Wellbutrin D/C'd  Tachycardia, likely due to pain;  - TSH wnl and CT angio negative for PE -  Not on beta blockers at home -  No history of benzo or EtOH abuse -  Does not appear clinically dehydrated - Worsened today when patient was up with Pt; Possibly from Xopenx Breathing Tx as well (now decreased dose) - Continue to Monitor   Hypokalemia, improved - K+ was 3.7 - Oral potassium repletion as necessary - Repeat CMP in AM  Normocytic anemia -  Hemoglobin decreased slightly, likely combination of IVF and hematuria -  Repeat Hb/Hct this AM was stable at 11.6/33.5 - Continue to  Monitor and Repeat CBC in AM  DVT prophylaxis: SCDs Code Status: FULL CODE Family Communication: No family at bedside Disposition Plan: SNF when Stable  Consultants:   Orthopedic Surgery Dr. Magnus Ivan   Procedures: None   Antimicrobials:  Anti-infectives    Start     Dose/Rate Route Frequency Ordered Stop   07/17/16 1200  cefTRIAXone (ROCEPHIN) 1 g in dextrose 5 % 50 mL IVPB  Status:  Discontinued     1 g 100 mL/hr over 30 Minutes Intravenous Every 24 hours 07/17/16 1122 07/18/16 1413     Subjective: Seen and examined at bedside and she was more confused. Husband at bedside and stated she was improved yesterday but worsened today. Stated she did not sleep well last night. Patient still in some pain and wanted to move and was fidgety. No Nausea or Vomiting.   Objective: Vitals:   07/18/16 2129 07/19/16 0326 07/19/16 0858 07/19/16 1515  BP: (!) 152/91 (!) 153/88  (!) 139/91  Pulse:  (!) 117 (!) 118 (!) 111  Resp:  18 20 19   Temp: 97.6 F (36.4 C) 97.9 F (36.6 C)  97.8 F (36.6 C)  TempSrc: Oral Oral  Oral  SpO2:  94% 95% 93%  Weight:      Height:        Intake/Output Summary (Last 24 hours) at 07/19/16 2001 Last data filed at 07/19/16 1300  Gross per 24 hour  Intake             1485 ml  Output             2675 ml  Net            -1190 ml   Filed Weights   07/15/16 1132  Weight: 52.2 kg (115 lb)   Examination: Physical Exam:  Constitutional: Thin Caucasian female in NAD appears confused and disoriented today ENMT: External Ears, Nose appear normal. Grossly normal hearing.  Neck: Appears normal, supple, no cervical masses, normal ROM, no appreciable thyromegaly, no JVD Respiratory: Diminished to auscultation bilaterally, but no no wheezing, rales, rhonchi or crackles. Normal respiratory effort and patient is not tachypenic. No accessory muscle use. Wearing Supplemental O2.  Cardiovascular: RRR, no murmurs / rubs / gallops. S1 and S2 auscultated.  Abdomen:  Soft, non-tender, non-distended. No masses palpated. No appreciable hepatosplenomegaly. Bowel sounds positive x4.  GU: Deferred. Musculoskeletal: No clubbing / cyanosis of digits/nails. No joint deformity upper and lower extremities.  Skin: No rashes, lesions, ulcers n limited skin eval. No induration; Warm and dry.  Neurologic: CN 2-12 grossly intact with no focal deficits. Sensation intact in all 4 Extremities, Romberg sign cerebellar reflexes not assessed.  Psychiatric: Normal judgment and insight. Alert but only oriented x1. Normal mood and appropriate affect.   Data Reviewed: I have personally reviewed following labs and imaging studies  CBC:  Recent Labs Lab 07/15/16 1419 07/16/16 0508 07/16/16 1445 07/17/16 0540 07/18/16 0531 07/19/16 0516  WBC 8.4 11.0*  6.4 6.5 6.4 11.4*  NEUTROABS 6.8  --   --   --   --   --   HGB 14.0 13.6 12.2 11.0* 11.3* 11.6*  HCT 40.9 40.5 36.3 32.9* 33.2* 33.5*  MCV 92.7 93.8 91.7 91.1 92.5 91.8  PLT 168 167 146* 123* 158 233   Basic Metabolic Panel:  Recent Labs Lab 07/16/16 0508 07/16/16 1445 07/17/16 0540 07/18/16 0531 07/19/16 0516  NA 137 136 137 137 140  K 4.3 3.5 3.2* 3.8 3.7  CL 103 106 105 105 109  CO2 23 23 25 24 23   GLUCOSE 137* 153* 155* 214* 121*  BUN 26* 17 10 16 14   CREATININE 2.62* 0.89 0.52 1.32* 0.59  CALCIUM 8.8* 7.9* 8.4* 9.2 9.4  MG  --   --   --   --  2.1  PHOS  --   --   --   --  1.8*   GFR: Estimated Creatinine Clearance: 53.2 mL/min (by C-G formula based on SCr of 0.59 mg/dL). Liver Function Tests: No results for input(s): AST, ALT, ALKPHOS, BILITOT, PROT, ALBUMIN in the last 168 hours. No results for input(s): LIPASE, AMYLASE in the last 168 hours. No results for input(s): AMMONIA in the last 168 hours. Coagulation Profile:  Recent Labs Lab 07/16/16 1445  INR 1.06   Cardiac Enzymes: No results for input(s): CKTOTAL, CKMB, CKMBINDEX, TROPONINI in the last 168 hours. BNP (last 3 results) No results  for input(s): PROBNP in the last 8760 hours. HbA1C: No results for input(s): HGBA1C in the last 72 hours. CBG:  Recent Labs Lab 07/18/16 0745 07/18/16 1200  GLUCAP 195* 223*   Lipid Profile: No results for input(s): CHOL, HDL, LDLCALC, TRIG, CHOLHDL, LDLDIRECT in the last 72 hours. Thyroid Function Tests:  Recent Labs  07/17/16 1221  TSH 2.013   Anemia Panel: No results for input(s): VITAMINB12, FOLATE, FERRITIN, TIBC, IRON, RETICCTPCT in the last 72 hours. Sepsis Labs: No results for input(s): PROCALCITON, LATICACIDVEN in the last 168 hours.  Recent Results (from the past 240 hour(s))  Culture, Urine     Status: None   Collection Time: 07/16/16  6:30 AM  Result Value Ref Range Status   Specimen Description URINE, CLEAN CATCH  Final   Special Requests NONE  Final   Culture   Final    NO GROWTH Performed at [facility], [address] [place], [place] [address]    Report Status 07/17/2016 FINAL  Final    Radiology Studies: No results found. Scheduled Meds: . acetaminophen  650 mg Oral TID  . arformoterol  15 mcg Nebulization BID  . feeding supplement (ENSURE ENLIVE)  237 mL Oral BID BM  . levalbuterol  0.63 mg Nebulization BID  . pravastatin  40 mg Oral QHS  . senna  1 tablet Oral BID  . umeclidinium bromide  1 puff Inhalation Daily   Continuous Infusions:   LOS: 4 days   Merlene Laughter, DO [facility] Pager [phone]  If 7PM-7AM, please contact night-coverage [email or web address] Password [email or web address] 07/19/2016, 8:01 PM

## 2016-07-19 NOTE — Care Management Important Message (Signed)
Important Message  Patient Details  Name: Linda Rangel MRN: [id number] Date of Birth: 08-22-1945   Medicare Important Message Given:  Yes    Caren MacadamFuller, Alanta Scobey 07/19/2016, 12:21 PMImportant Message  Patient Details  Name: Linda Rangel MRN: [id number] Date of Birth: 08-22-1945   Medicare Important Message Given:  Yes    Caren MacadamFuller, Jarold Macomber 07/19/2016, 12:20 PM

## 2016-07-20 LAB — COMPREHENSIVE METABOLIC PANEL
ALBUMIN: 3.4 g/dL — AB (ref 3.5–5.0)
ALK PHOS: 131 U/L — AB (ref 38–126)
ALT: 50 U/L (ref 14–54)
AST: 46 U/L — AB (ref 15–41)
Anion gap: 10 (ref 5–15)
BILIRUBIN TOTAL: 1.3 mg/dL — AB (ref 0.3–1.2)
BUN: 26 mg/dL — AB (ref 6–20)
CO2: 22 mmol/L (ref 22–32)
Calcium: 8.9 mg/dL (ref 8.9–10.3)
Chloride: 108 mmol/L (ref 101–111)
Creatinine, Ser: 1.3 mg/dL — ABNORMAL HIGH (ref 0.44–1.00)
GFR calc Af Amer: 47 mL/min — ABNORMAL LOW (ref >60)
GFR calc non Af Amer: 40 mL/min — ABNORMAL LOW (ref >60)
GLUCOSE: 102 mg/dL — AB (ref 65–99)
POTASSIUM: 3.3 mmol/L — AB (ref 3.5–5.1)
Sodium: 140 mmol/L (ref 135–145)
TOTAL PROTEIN: 6.6 g/dL (ref 6.5–8.1)

## 2016-07-20 LAB — URINALYSIS, ROUTINE W REFLEX MICROSCOPIC
BILIRUBIN URINE: NEGATIVE
Glucose, UA: NEGATIVE mg/dL
KETONES UR: 20 mg/dL — AB
Nitrite: NEGATIVE
PROTEIN: 100 mg/dL — AB
Specific Gravity, Urine: 1.017 (ref 1.005–1.030)
Squamous Epithelial / LPF: NONE SEEN
pH: 8 (ref 5.0–8.0)

## 2016-07-20 LAB — CBC WITH DIFFERENTIAL/PLATELET
BASOS ABS: 0 10*3/uL (ref 0.0–0.1)
Basophils Relative: 0 %
Eosinophils Absolute: 0.3 10*3/uL (ref 0.0–0.7)
Eosinophils Relative: 4 %
HCT: 36.2 % (ref 36.0–46.0)
HEMOGLOBIN: 12 g/dL (ref 12.0–15.0)
Lymphocytes Relative: 20 %
Lymphs Abs: 1.8 10*3/uL (ref 0.7–4.0)
MCH: 30.4 pg (ref 26.0–34.0)
MCHC: 33.1 g/dL (ref 30.0–36.0)
MCV: 91.6 fL (ref 78.0–100.0)
Monocytes Absolute: 1.3 10*3/uL — ABNORMAL HIGH (ref 0.1–1.0)
Monocytes Relative: 15 %
NEUTROS PCT: 61 %
Neutro Abs: 5.5 10*3/uL (ref 1.7–7.7)
Platelets: 259 10*3/uL (ref 150–400)
RBC: 3.95 MIL/uL (ref 3.87–5.11)
RDW: 14 % (ref 11.5–15.5)
WBC: 9.1 10*3/uL (ref 4.0–10.5)

## 2016-07-20 LAB — PHOSPHORUS: Phosphorus: 1.9 mg/dL — ABNORMAL LOW (ref 2.5–4.6)

## 2016-07-20 LAB — MAGNESIUM: Magnesium: 2 mg/dL (ref 1.7–2.4)

## 2016-07-20 MED ORDER — POTASSIUM CHLORIDE CRYS ER 20 MEQ PO TBCR
40.0000 meq | EXTENDED_RELEASE_TABLET | Freq: Three times a day (TID) | ORAL | Status: AC
  Administered 2016-07-20 (×2): 40 meq via ORAL
  Filled 2016-07-20 (×2): qty 2

## 2016-07-20 MED ORDER — ENSURE ENLIVE PO LIQD: 237.0000 mL | Freq: Two times a day (BID) | ORAL | 12 refills | Status: AC

## 2016-07-20 MED ORDER — SODIUM CHLORIDE 0.9 % IV SOLN
INTRAVENOUS | Status: DC
  Administered 2016-07-20: 09:00:00 via INTRAVENOUS

## 2016-07-20 MED ORDER — ACETAMINOPHEN 325 MG PO TABS: 650.0000 mg | ORAL_TABLET | Freq: Three times a day (TID) | ORAL | 0 refills | Status: AC

## 2016-07-20 MED ORDER — IBUPROFEN 200 MG PO TABS: 400.0000 mg | ORAL_TABLET | Freq: Four times a day (QID) | ORAL | 0 refills | Status: AC | PRN

## 2016-07-20 MED ORDER — SENNA 8.6 MG PO TABS: 1.0000 | ORAL_TABLET | Freq: Two times a day (BID) | ORAL | 0 refills | Status: AC

## 2016-07-20 NOTE — Progress Notes (Signed)
5:00 PM Authorization received [id number]. D/C summary faxed to SNF.  [facility] transport "extensive wait" LCSWA informed pt. And spouse at bedside.  No other needs identified. Nurse given report.

## 2016-07-20 NOTE — Progress Notes (Signed)
Pt needing assistance with ADL's including but not limited to bathing, feeding, dressing, ambulating, and grooming.

## 2016-07-20 NOTE — Discharge Summary (Addendum)
Physician Discharge Summary  Linda Rangel ZOX:[id number] DOB: 09-Feb-1946 DOA: 07/15/2016  PCP: Ninetta Lights, MD  Admit date: 07/15/2016 Discharge date: 07/20/2016  Admitted From: Home Disposition:  SNF at [facility]   Recommendations for Outpatient Follow-up:  1. Follow up with PCP in 1-2 weeks 2. Follow up with Urology as an outpatient for removal of Foley and evaluation of Hematuria (though it is resolving) 3. Weight Bearing As Tolerated 4. Please obtain BMP/CBC within one week; Repeat CMP in AM to evaluate AKI  Home Health: No Equipment/Devices: None  Discharge Condition: Stable CODE STATUS: FULL CODE Diet recommendation: Heart Healthy Brief/Interim Summary: Linda B Browneis a 71 y.o.femalewith history of COPD non O2 dependentwho presented to the ED after mechanical fall at home. She reported that she stood up from the sofa and her foot slipped on the hardwood floor. She denied head injury, loss of consciousness or neck pain. Patient had pain in her left leg and left buttock which was severe andpelvis x-ray confirmed a nondisplaced rupture of the left superior and inferior pubic rami. Dr. Magnus Ivan orthopedic surgery advised pain control and Weightbearing as Toelrated, but she was unable to tolerate ambulation. Additionally, she developed AKI and had minimal urine output. Foley catheter was placed and she had vibrant hematuria. Hematuria is resolving and AKI was worse today as patient had pulled out Foley Catheter last night. Hospitalization was complicated by Confusion/Delirium and Leukocytosis (likely reactive) and both were improved today. Per husband she is more at her baseline. At this time patient was deemed medically stable for D/C and will be transferred to SNF for Rehab. We will continue foley at this time and have Urology evaluate patient as an outpatient prior to removal.   Discharge Diagnoses:  Active Problems:   Hematuria   Pubic ramus fracture (HCC)   Chronic  obstructive pulmonary disease (HCC)   AKI (acute kidney injury) (HCC)   Generalized anxiety disorder   Acute respiratory failure with hypoxia (HCC)   Sinus tachycardia  Non-Displaced Superior and Inferior Left Pubic ramus fracture (HCC) s/p Mechanical Fall - Patient not able to ambulate due to severe pain.  - D/c'd  morphine (caused lasting confusion) - Utram 50 mg po q6hprn Discontinued; C/w Tylenol 650 mg po TID scheduled  - PT assessment appreciated and Recommend SNF and Rolling walker with 5" wheels - SW consult placed for SNF and patient going to [employer] [employer] - Follow up with Physician at [facility]  Acute respiratory failure with hypoxia, suspect this is due to atelectasis in setting of emphysema/COPD, improved - Not on Home O2  - CT angio negative for PE and pneumonia; D-Dimer was elevated at 4.35 - WeanO2 as tolerated, No longer requiring O2 - Scheduled Xopenex 1.25 mg Neb BID changed to 0.63 BID in the Hosptial - Was on Brovana 15 mcg BID and ellipta 1 puff IH Daily while in the Hospital; Will continue Outpatient Stiolto 2.5-2.5 mcg/act - D/C'd Methylprednisolone 40 mg IV Daily because of possible steroid confusion - CXR on 07/16/16 showed Suboptimal inspiration accounts for mild bibasilar atelectasis. Stable moderate changes of chronic bronchitis and/or asthma. No acute cardiopulmonary disease otherwise - Repeat CXR as an outpatient  Acute Delirium, improved -Improved after patient was able to sleep -Likely Medication Induced from Morphine -Pain control with Tylenol; Ultram D/C'd -D/C'd IV Ceftriaxone as Urine Cx did not grow anything  -D/C'd Haldol 2.5 mg IV q6hprn for Agitation -D/C'd Given Atarax 10 mg TIDprn for Agiation -D/C'd Benadryl 50 mg qHS prn for Sleep -Delirium Precautionsl -  Discontinued IV Steroids  Leukocytosis, improved -WBC went from 6.4 -> 11.4 -> 9.1 (was 11.0 on 2/12) -Could be related to pain vs. IV Steroid Use -No active S/Sx of Infection and  patient Afebrile -Continue to Monitor and repeat CBC at SNF  AKI  - Urine Sodium was 80 and Urine Cr was 209.06 - Urine Cx showed No Growth - Pulled out foley last night and AKI got worse - Baseline Cr 0.59 on 05/31/16 at [facility] - up to 2.62 on 2/12.   - Gentle IVF with NS at 75 mL/hr - Repeat CMP in AM at SNF  Hematuria (likely Traumatic) after foley placement.  - Was dark blood with large clots initially. Urine slightly pink this AM - Flush foley prn - D/C'd IVF again at 75 mL/hr - May need Bladder Irrigation and Urology Consult if not improving - Continue to Monitor and Repeat CBC in AM  Anxiety  - Patient not taking any Home medications at this time  - Was given 0.5 mg IV Ativan previously - D/C'd Atarax for Anxiety; Home Wellbutrin D/C'd as she had stopped taking it - Continue with Redirection  Tachycardia, likely due to pain;  - TSH wnl and CT angio negative for PE - Not on beta blockers at home - No history of benzo or EtOH abuse - Does not appear clinically dehydrated - Worse when patient was up with Pt; Possibly from Xopenx Breathing Tx as well (now decreased dose) - Continue to Monitor and Treat pain accordingly   Hypokalemia,  - K+ was 3.3 - Oral potassium repletion as necessary - Repeat CMP in AM at SNF  Normocyticanemia - Hemoglobin decreased slightly, likely combination of IVF and hematuria - Repeat Hb/Hct this AM was stable at 12.0/36.2 - Continue to Monitor and Repeat CBC at SNF  Discharge Instructions  Discharge Instructions    Call MD for:  difficulty breathing, headache or visual disturbances    Complete by:  As directed    Call MD for:  extreme fatigue    Complete by:  As directed    Call MD for:  persistant dizziness or light-headedness    Complete by:  As directed    Call MD for:  persistant nausea and vomiting    Complete by:  As directed    Call MD for:  severe uncontrolled pain    Complete by:  As directed    Call MD for:   temperature >100.4    Complete by:  As directed    Diet - low sodium heart healthy    Complete by:  As directed    Discharge instructions    Complete by:  As directed    Follow Up Care at [facility]   Increase activity slowly    Complete by:  As directed      Allergies as of 07/20/2016      Reactions   Celecoxib    Codeine    Ibandronate Sodium    REACTION: increased arthritic pain   Prednisone    REACTION: Generalized Swelling   Rofecoxib       Medication List    STOP taking these medications   buPROPion 150 MG 12 hr tablet Commonly known as:  WELLBUTRIN SR   SPIRIVA HANDIHALER 18 MCG inhalation capsule Generic drug:  tiotropium     TAKE these medications   acetaminophen 325 MG tablet Commonly known as:  TYLENOL Take 2 tablets (650 mg total) by mouth 3 (three) times daily.   feeding  supplement (ENSURE ENLIVE) Liqd Take 237 mLs by mouth 2 (two) times daily between meals.   gabapentin 100 MG capsule Commonly known as:  NEURONTIN Take 100 mg by mouth 3 (three) times daily.   ibuprofen 200 MG tablet Commonly known as:  ADVIL,MOTRIN Take 2 tablets (400 mg total) by mouth every 6 (six) hours as needed.   pravastatin 40 MG tablet Commonly known as:  PRAVACHOL Take 40 mg by mouth at bedtime.   senna 8.6 MG Tabs tablet Commonly known as:  SENOKOT Take 1 tablet (8.6 mg total) by mouth 2 (two) times daily.   STIOLTO RESPIMAT 2.5-2.5 MCG/ACT Aers Generic drug:  Tiotropium Bromide-Olodaterol Inhale 2 puffs into the lungs daily.      Follow-up Information    FURR,SARA, MD Follow up.   Specialty:  Internal Medicine Contact information: [address] [place] [address] [phone]        Chelsea Aus, MD. Schedule an appointment as soon as possible for a visit in 1 week(s).   Specialty:  Urology Why:  Have SNF refer to Urology Contact information: [address] [place] [address] [phone]          Allergies   Allergen Reactions  . Celecoxib   . Codeine   . Ibandronate Sodium     REACTION: increased arthritic pain  . Prednisone     REACTION: Generalized Swelling  . Rofecoxib    Consultations:  Orthopedics Dr. Magnus Ivan  Procedures/Studies: Dg Chest 2 View  Result Date: 07/15/2016 CLINICAL DATA:  Chest wall pain EXAM: CHEST  2 VIEW COMPARISON:  06/05/2008 FINDINGS: There is hyperinflation of the lungs compatible with COPD. Heart and mediastinal contours are within normal limits. No focal opacities or effusions. No acute bony abnormality. IMPRESSION: COPD.  No active disease. Electronically Signed   By: Charlett Nose M.D.   On: 07/15/2016 13:17   Ct Angio Chest Pe W Or Wo Contrast  Result Date: 07/17/2016 CLINICAL DATA:  COPD, dyspnea EXAM: CT ANGIOGRAPHY CHEST WITH CONTRAST TECHNIQUE: Multidetector CT imaging of the chest was performed using the standard protocol during bolus administration of intravenous contrast. Multiplanar CT image reconstructions and MIPs were obtained to evaluate the vascular anatomy. CONTRAST:  Pain 100 cc Omnipaque COMPARISON:  CXR 07/16/2016 FINDINGS: Cardiovascular: Normal branch pattern for the great vessels. No pulmonary embolus, aortic dissection or aneurysm. Normal size cardiac chambers with coronary arteriosclerosis. No pericardial effusion. Mediastinum/Nodes: No adenopathy. Normal esophagus, trachea and mainstem bronchi. No thyromegaly. 4 mm cystic nodule in the right thyroid gland. Lungs/Pleura: Centrilobular emphysema bilaterally with subpleural areas of mild interstitial fibrosis. Small bilateral pleural effusions with adjacent atelectasis. No pulmonic consolidation, pneumothorax nor dominant mass. Upper Abdomen: Colonic interposition over the liver. Normal bilateral adrenal glands. No space-occupying mass of the visualized liver. There is no splenomegaly. The visualized kidneys and pancreas are grossly unremarkable. Musculoskeletal: Degenerative change along the  dorsal spine. Chronic appearing chronic appearing lower thoracic compression fractures from T10 through T12 and also T7. Review of the MIP images confirms the above findings. IMPRESSION: No acute pulmonary embolus. Coronary arteriosclerosis. COPD without pulmonic consolidation. Trace bilateral pleural effusions right slightly greater than left. Chronic appearing T7 and T10 through T12 compression with degenerative change along the dorsal spine. Electronically Signed   By: Tollie Eth M.D.   On: 07/17/2016 17:43   US Renal  Result Date: 07/16/2016 CLINICAL DATA:  Acute renal insufficiency EXAM: RENAL ULTRASOUND COMPARISON:  Abdominal ultrasound April 11, 2010 FINDINGS: Right  Kidney: Length: 11.2 cm. Echogenicity and renal cortical thickness are within normal limits. No mass, perinephric fluid, or hydronephrosis visualized. No sonographically demonstrable calculus or ureterectasis. Left Kidney: Length: 10.9 cm. Echogenicity and renal cortical thickness are within normal limits. No mass, perinephric fluid, or hydronephrosis visualized. No sonographically demonstrable calculus or ureterectasis. Bladder: Decompressed with Foley catheter and cannot be interrogated. IMPRESSION: No renal lesions evident on this study. Electronically Signed   By: Bretta BangWilliam  Woodruff III M.D.   On: 07/16/2016 11:27   Dg Chest Port 1 View  Result Date: 07/16/2016 CLINICAL DATA:  71 year old with current history of COPD, presenting with acute shortness of breath and hypoxia. EXAM: PORTABLE CHEST 1 VIEW COMPARISON:  07/15/2016, with 04/05/2009 and earlier. FINDINGS: Suboptimal inspiration accounts for crowded bronchovascular markings diffusely and atelectasis in the bases, and accentuates the cardiac silhouette. Taking this into account, cardiac silhouette normal in size and unchanged. Bullous emphysematous changes in the upper lobes, prominent bronchovascular markings diffusely and mild to moderate central peribronchial thickening,  unchanged. Postsurgical changes involving the left lung. Lungs otherwise clear. IMPRESSION: 1. Suboptimal inspiration accounts for mild bibasilar atelectasis. 2. Stable moderate changes of chronic bronchitis and/or asthma. No acute cardiopulmonary disease otherwise. Electronically Signed   By: Hulan Saashomas  Lawrence M.D.   On: 07/16/2016 14:20   Dg Shoulder Left  Result Date: 07/15/2016 CLINICAL DATA:  Left shoulder pain, status post fall 6 hours ago EXAM: LEFT SHOULDER - 2+ VIEW COMPARISON:  None. FINDINGS: No fracture or dislocation is seen. The joint spaces are preserved. The visualized soft tissues are unremarkable. Visualized left lung is clear. IMPRESSION: Negative. Electronically Signed   By: Charline BillsSriyesh  Krishnan M.D.   On: 07/15/2016 13:16   Dg Hip Unilat With Pelvis 2-3 Views Left  Result Date: 07/15/2016 CLINICAL DATA:  Status post fall, left hip pain EXAM: DG HIP (WITH OR WITHOUT PELVIS) 2-3V LEFT COMPARISON:  None. FINDINGS: There is no evidence of hip fracture or dislocation. There is a nondisplaced fracture of the left superior and inferior pubic rami. There is generalized osteopenia. There is no evidence of arthropathy or other focal bone abnormality. IMPRESSION: No acute osseous injury of the left hip. Nondisplaced fracture of the left superior and inferior pubic rami. Electronically Signed   By: Elige KoHetal  Patel   On: 07/15/2016 12:22    Subjective: Seen and examined at bedside and was doing much better. Was more lucid this AM and not as confused and was able to tell me the Month, Day, Year, and who the President was. Patient's husband at bedside stated she was able to get some sleep and it has helped. No Nausea or Vomiting and has some pain. No other concerns or complaints.  Discharge Exam: Vitals:   07/20/16 0800 07/20/16 1319  BP: (!) 153/96 (!) 144/84  Pulse: 69 (!) 107  Resp: 20 20  Temp: 98.1 F (36.7 C) 98.3 F (36.8 C)   Vitals:   07/20/16 0800 07/20/16 1011 07/20/16 1018 07/20/16  1319  BP: (!) 153/96   (!) 144/84  Pulse: 69   (!) 107  Resp: 20   20  Temp: 98.1 F (36.7 C)   98.3 F (36.8 C)  TempSrc: Oral   Oral  SpO2: 91% 90% 90% 91%  Weight:      Height:       General: Pt is alert, awake, not in acute distress Cardiovascular: RRR, S1/S2 +, no rubs, no gallops Respiratory: CTA bilaterally, no wheezing, no rhonchi; Patient not tachypenic or using any  accessory muscles. Not wearing any O2 via [place].  Abdominal: Soft, NT, ND, bowel sounds + Extremities: no edema, no cyanosis; Venous Stasis discoloration on legs  The results of significant diagnostics from this hospitalization (including imaging, microbiology, ancillary and laboratory) are listed below for reference.    Microbiology: Recent Results (from the past 240 hour(s))  Culture, Urine     Status: None   Collection Time: 07/16/16  6:30 AM  Result Value Ref Range Status   Specimen Description URINE, CLEAN CATCH  Final   Special Requests NONE  Final   Culture   Final    NO GROWTH Performed at [facility], [address] [place], [place] [address]    Report Status 07/17/2016 FINAL  Final    Labs: BNP (last 3 results) No results for input(s): BNP in the last 8760 hours. Basic Metabolic Panel:  Recent Labs Lab 07/16/16 1445 07/17/16 0540 07/18/16 0531 07/19/16 0516 07/20/16 0512  NA 136 137 137 140 140  K 3.5 3.2* 3.8 3.7 3.3*  CL 106 105 105 109 108  CO2 23 25 24 23 22   GLUCOSE 153* 155* 214* 121* 102*  BUN 17 10 16 14  26*  CREATININE 0.89 0.52 1.32* 0.59 1.30*  CALCIUM 7.9* 8.4* 9.2 9.4 8.9  MG  --   --   --  2.1 2.0  PHOS  --   --   --  1.8* 1.9*   Liver Function Tests:  Recent Labs Lab 07/20/16 0512  AST 46*  ALT 50  ALKPHOS 131*  BILITOT 1.3*  PROT 6.6  ALBUMIN 3.4*   No results for input(s): LIPASE, AMYLASE in the last 168 hours. No results for input(s): AMMONIA in the last 168 hours. CBC:  Recent Labs Lab 07/15/16 1419  07/16/16 1445 07/17/16 0540  07/18/16 0531 07/19/16 0516 07/20/16 0512  WBC 8.4  < > 6.4 6.5 6.4 11.4* 9.1  NEUTROABS 6.8  --   --   --   --   --  5.5  HGB 14.0  < > 12.2 11.0* 11.3* 11.6* 12.0  HCT 40.9  < > 36.3 32.9* 33.2* 33.5* 36.2  MCV 92.7  < > 91.7 91.1 92.5 91.8 91.6  PLT 168  < > 146* 123* 158 233 259  < > = values in this interval not displayed. Cardiac Enzymes: No results for input(s): CKTOTAL, CKMB, CKMBINDEX, TROPONINI in the last 168 hours. BNP: Invalid input(s): POCBNP CBG:  Recent Labs Lab 07/18/16 0745 07/18/16 1200  GLUCAP 195* 223*   D-Dimer No results for input(s): DDIMER in the last 72 hours. Hgb A1c No results for input(s): HGBA1C in the last 72 hours. Lipid Profile No results for input(s): CHOL, HDL, LDLCALC, TRIG, CHOLHDL, LDLDIRECT in the last 72 hours. Thyroid function studies No results for input(s): TSH, T4TOTAL, T3FREE, THYROIDAB in the last 72 hours.  Invalid input(s): FREET3 Anemia work up No results for input(s): VITAMINB12, FOLATE, FERRITIN, TIBC, IRON, RETICCTPCT in the last 72 hours. Urinalysis    Component Value Date/Time   COLORURINE YELLOW 07/20/2016 0908   APPEARANCEUR HAZY (A) 07/20/2016 0908   LABSPEC 1.017 07/20/2016 0908   PHURINE 8.0 07/20/2016 0908   GLUCOSEU NEGATIVE 07/20/2016 0908   HGBUR LARGE (A) 07/20/2016 0908   BILIRUBINUR NEGATIVE 07/20/2016 0908   KETONESUR 20 (A) 07/20/2016 0908   PROTEINUR 100 (A) 07/20/2016 0908   UROBILINOGEN 0.2 10/26/2008 1409   NITRITE NEGATIVE 07/20/2016 0908   LEUKOCYTESUR SMALL (A) 07/20/2016 0908   Sepsis  Labs Invalid input(s): PROCALCITONIN,  WBC,  LACTICIDVEN Microbiology Recent Results (from the past 240 hour(s))  Culture, Urine     Status: None   Collection Time: 07/16/16  6:30 AM  Result Value Ref Range Status   Specimen Description URINE, CLEAN CATCH  Final   Special Requests NONE  Final   Culture   Final    NO GROWTH Performed at [facility], [address] [place], [place] [address]     Report Status 07/17/2016 FINAL  Final   Time coordinating discharge: Over 30 minutes  SIGNED:  Merlene Laughter, DO [facility] 07/20/2016, 1:26 PM Pager [phone]  If 7PM-7AM, please contact night-coverage [email or web address] Password [email or web address]

## 2016-07-20 NOTE — Progress Notes (Signed)
[facility] arrived at 2200. All belongings sent with husband, except for glass, which were on the patient. Pt d/c'd to [facility] with foley.

## 2016-07-20 NOTE — Progress Notes (Signed)
Pt found in the bed with foley catheter beside her with balloon fully inflated. Pt denies any pain and no bleeding noted from perineum. Pt reoriented to being in the hospital and needing the catheter in to make sure she is urinating. Pt nods with understanding but reinforcement needed. Mittens placed on pt's hands. 14Fr foley catheter reinserted without difficulty or complaints from the pt. 425Lml of amber colored urine with sediment returned.

## 2016-07-20 NOTE — Progress Notes (Signed)
Plan for d/c to SNF, discharge planning per CSW. [phone] 

## 2016-07-20 NOTE — Progress Notes (Signed)
[facility]oley in place per MD. Family at bedside and notified.

## 2016-07-21 LAB — URINE CULTURE: CULTURE: NO GROWTH

## 2018-03-09 IMAGING — CT CT ANGIO CHEST
2 of 6 series · 19 of 46 positions shown · IV contrast (APPLIED)
Comparison: CXR 07/16/2016

CLINICAL DATA: COPD, dyspnea

EXAM:
CT ANGIOGRAPHY CHEST WITH CONTRAST
TECHNIQUE: Multidetector CT imaging of the chest was performed using the
standard protocol during bolus administration of intravenous
contrast. Multiplanar CT image reconstructions and MIPs were
obtained to evaluate the vascular anatomy.
CONTRAST:  Pain 100 cc Omnipaque

[Series 6: thins · axial · 0.66mm/px · z∈[+1252,+1505]mm · 17 of 279 slices shown]
[im 13/279  lung]
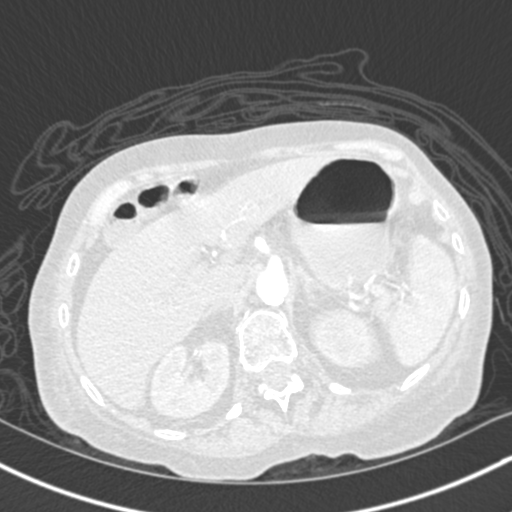
[im 25/279  soft-tissue]
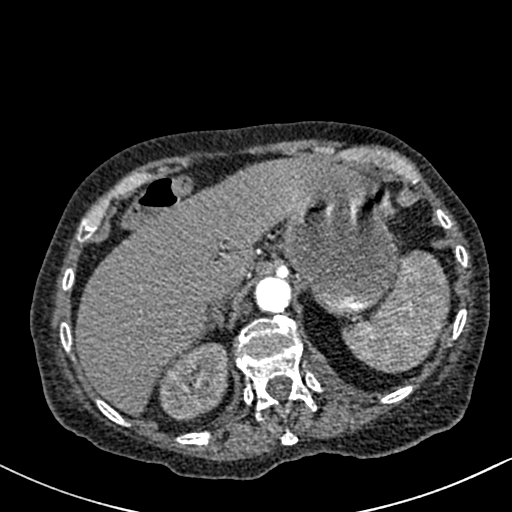
[im 49/279  lung]
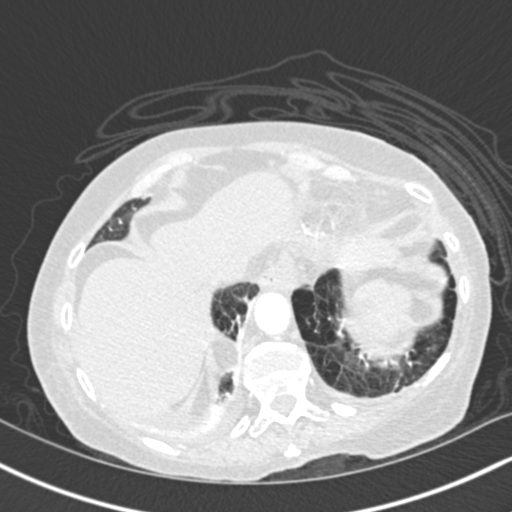
[im 61/279  soft-tissue]
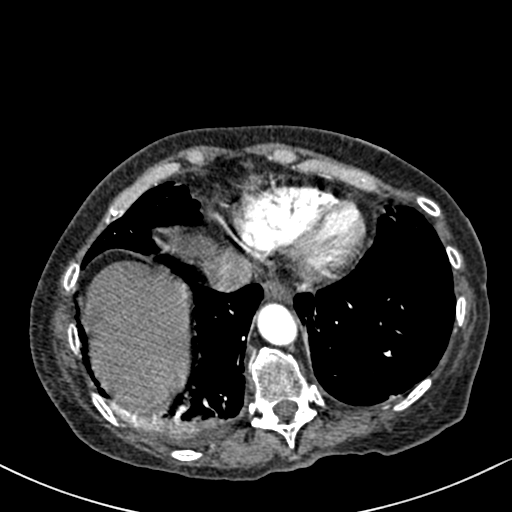
[im 73/279  lung]
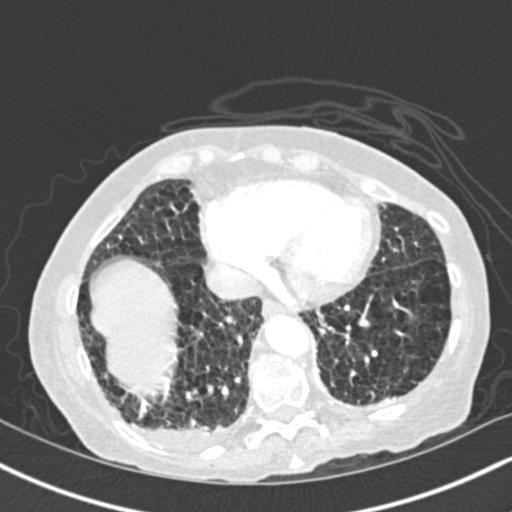
[im 97/279  soft-tissue]
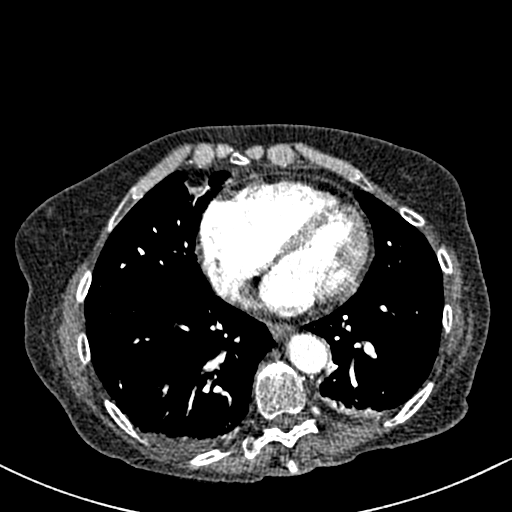
[im 109/279  lung]
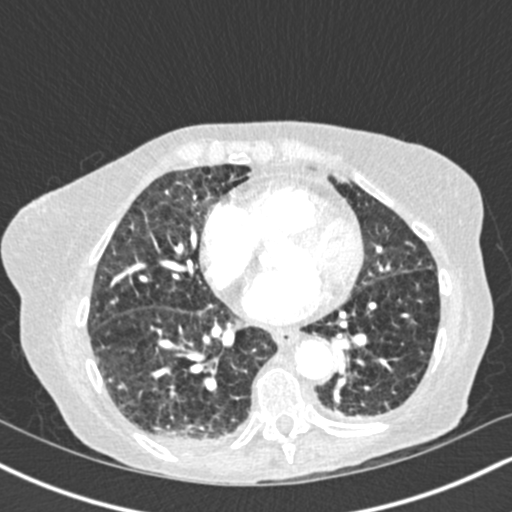
[im 121/279  soft-tissue]
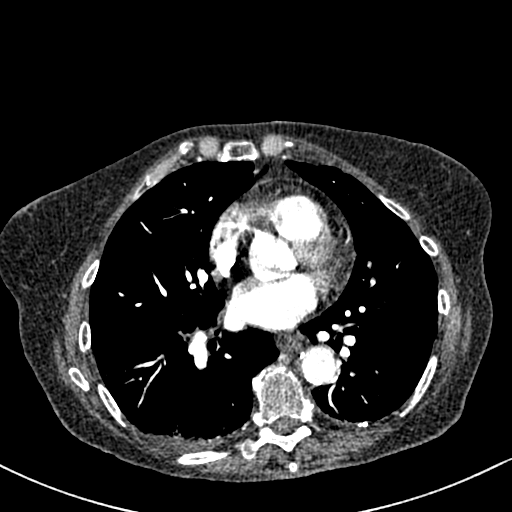
[im 146/279  lung]
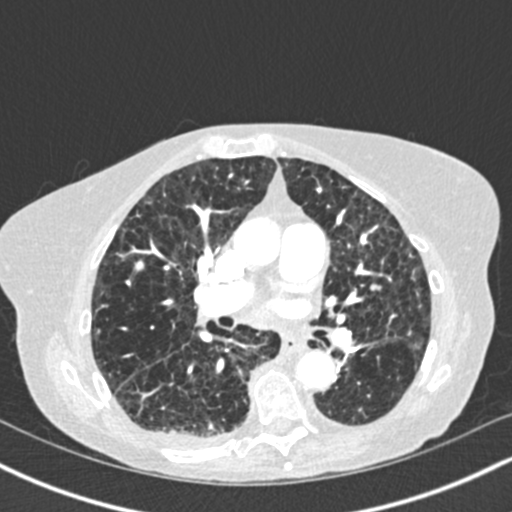
[im 158/279  soft-tissue]
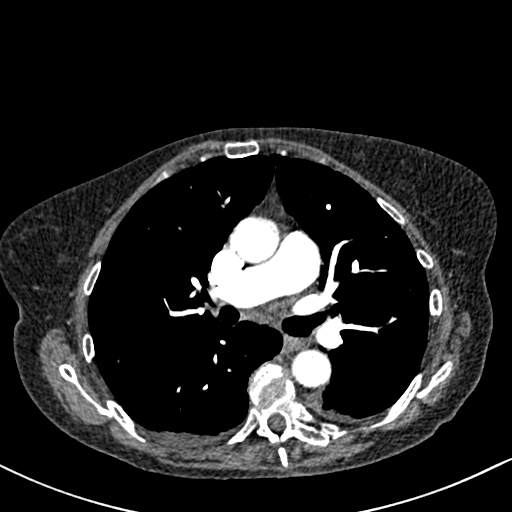
[im 170/279  lung]
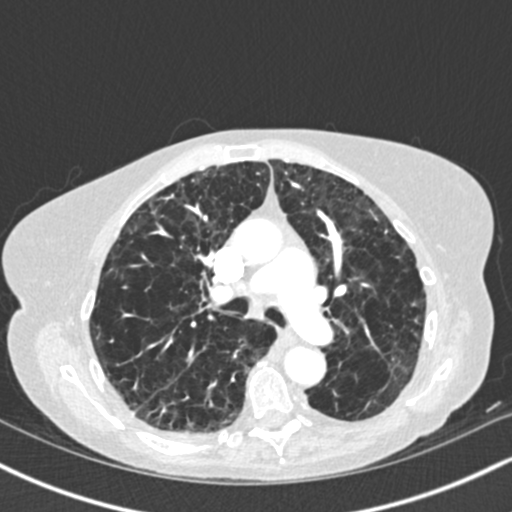
[im 182/279  soft-tissue]
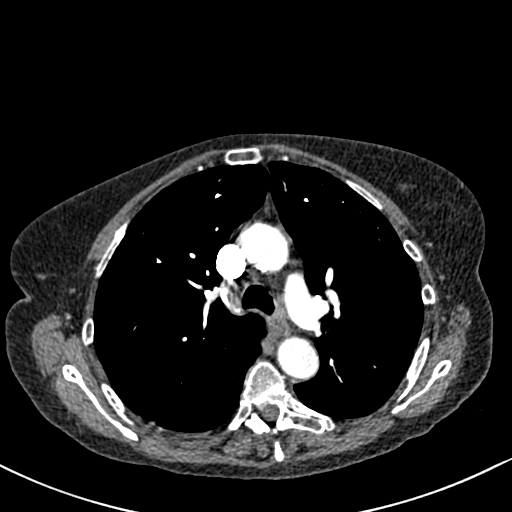
[im 206/279  lung]
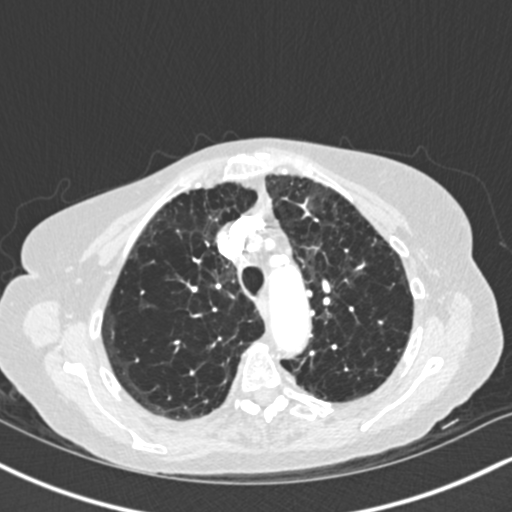
[im 218/279  soft-tissue]
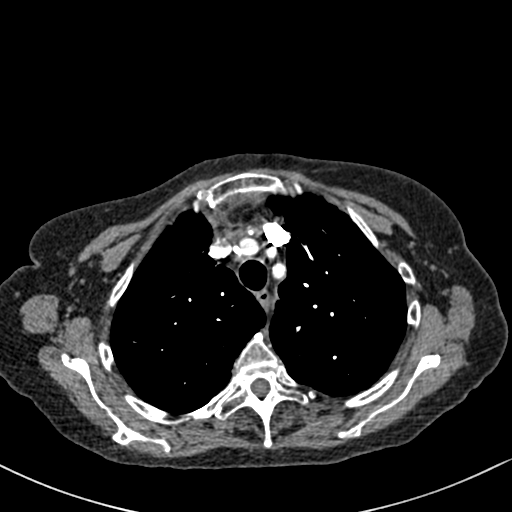
[im 230/279  lung]
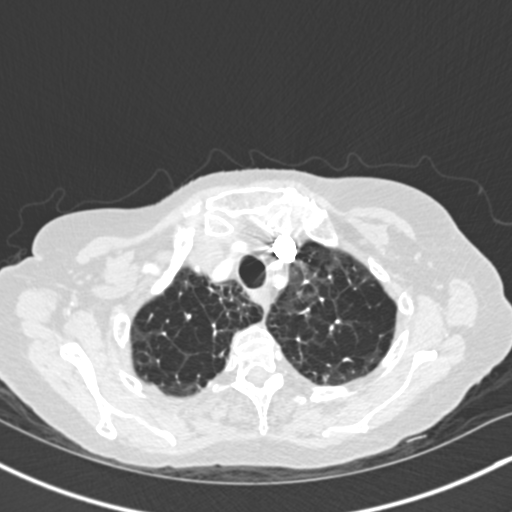
[im 254/279  soft-tissue]
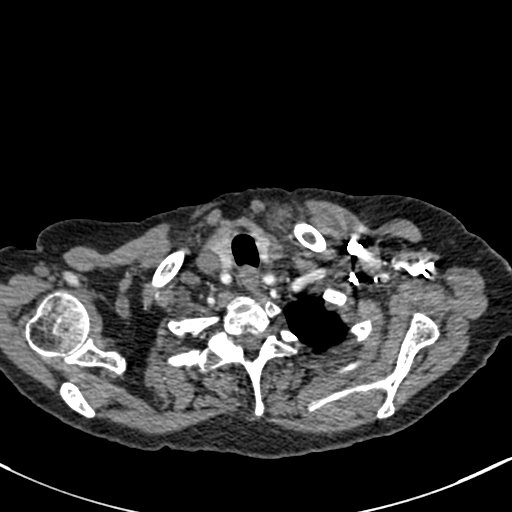
[im 266/279  lung]
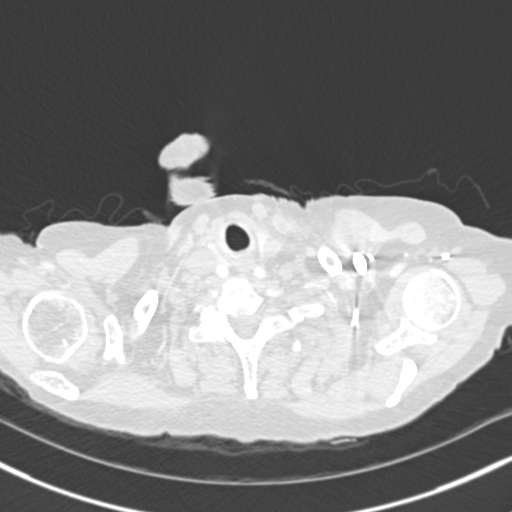

[Series 8: coronal mpr · coronal · 0.54mm/px · 2 of 82 slices shown]
[im 28/82  soft-tissue]
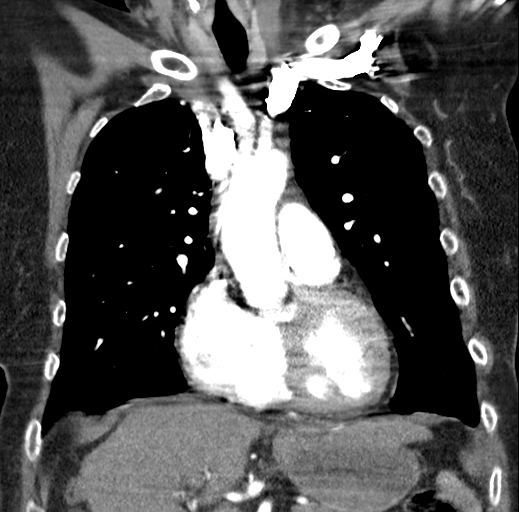
[im 55/82  soft-tissue]
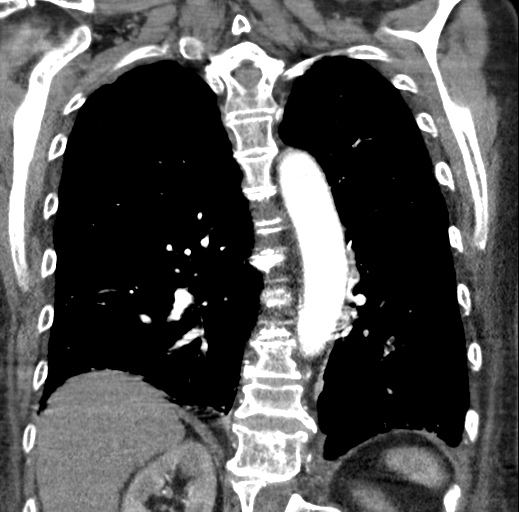

[19 of 46 positions shown; findings below may reference images not displayed]

FINDINGS: Cardiovascular: Normal branch pattern for the great vessels. No
pulmonary embolus, aortic dissection or aneurysm. Normal size
cardiac chambers with coronary arteriosclerosis. No pericardial
effusion.

Mediastinum/Nodes: No adenopathy. Normal esophagus, trachea and
mainstem bronchi. No thyromegaly. 4 mm cystic nodule in the right
thyroid gland.

Lungs/Pleura: Centrilobular emphysema bilaterally with subpleural
areas of mild interstitial fibrosis. Small bilateral pleural
effusions with adjacent atelectasis. No pulmonic consolidation,
pneumothorax nor dominant mass.

Upper Abdomen: Colonic interposition over the liver. Normal
bilateral adrenal glands. No space-occupying mass of the visualized
liver. There is no splenomegaly. The visualized kidneys and pancreas
are grossly unremarkable.

Musculoskeletal: Degenerative change along the dorsal spine. Chronic
appearing chronic appearing lower thoracic compression fractures
from T10 through T12 and also T7.

Review of the MIP images confirms the above findings.
IMPRESSION: No acute pulmonary embolus.

Coronary arteriosclerosis.

COPD without pulmonic consolidation. Trace bilateral pleural
effusions right slightly greater than left.

Chronic appearing T7 and T10 through T12 compression with
degenerative change along the dorsal spine.

## 2023-10-03 DEATH — deceased
